# Patient Record
Sex: Male | Born: 1980
Health system: Southern US, Community
[De-identification: ages and names within clinical notes are randomized; demographics above are authoritative.]

## PROBLEM LIST (undated history)

## (undated) DIAGNOSIS — E109 Type 1 diabetes mellitus without complications: Secondary | ICD-10-CM

## (undated) HISTORY — DX: Type 1 diabetes mellitus without complications: E10.9

---

## 2018-01-05 LAB — HEMOGLOBIN A1C: Hemoglobin A1C: 9.7

## 2018-01-17 ENCOUNTER — Encounter: Payer: Self-pay | Admitting: "Endocrinology

## 2018-01-17 ENCOUNTER — Ambulatory Visit: Payer: PRIVATE HEALTH INSURANCE | Admitting: "Endocrinology

## 2018-01-17 VITALS — BP 114/77 | HR 84 | Ht 72.0 in | Wt 147.0 lb

## 2018-01-17 DIAGNOSIS — E1065 Type 1 diabetes mellitus with hyperglycemia: Secondary | ICD-10-CM | POA: Diagnosis not present

## 2018-01-17 DIAGNOSIS — F17200 Nicotine dependence, unspecified, uncomplicated: Secondary | ICD-10-CM | POA: Insufficient documentation

## 2018-01-17 MED ORDER — INSULIN GLARGINE 300 UNIT/ML ~~LOC~~ SOPN: 20.0000 [IU] | PEN_INJECTOR | Freq: Every day | SUBCUTANEOUS | 2 refills | Status: DC

## 2018-01-17 MED ORDER — ONETOUCH VERIO W/DEVICE KIT: 1.0000 | PACK | 0 refills | Status: DC | PRN

## 2018-01-17 MED ORDER — GLUCOSE BLOOD VI STRP: ORAL_STRIP | 3 refills | Status: DC

## 2018-01-17 MED ORDER — INSULIN LISPRO 100 UNIT/ML (KWIKPEN): 5.0000 [IU] | PEN_INJECTOR | Freq: Three times a day (TID) | SUBCUTANEOUS | 3 refills | Status: DC

## 2018-01-17 NOTE — Progress Notes (Signed)
Endocrinology Consult Note       01/17/2018, 1:52 PM   Subjective:    Patient ID: Jose Walters, male    DOB: 1981-04-29.  Jose Walters is being seen in consultation for management of currently uncontrolled symptomatic diabetes requested by  [name], MD.   Past Medical History:  Diagnosis Date  . Diabetes mellitus type I ([place])    History reviewed. No pertinent surgical history. Social History   Socioeconomic History  . Marital status: Married    Spouse name: Not on file  . Number of children: Not on file  . Years of education: Not on file  . Highest education level: Not on file  Occupational History  . Not on file  Social Needs  . Financial resource strain: Not on file  . Food insecurity:    Worry: Not on file    Inability: Not on file  . Transportation needs:    Medical: Not on file    Non-medical: Not on file  Tobacco Use  . Smoking status: Current Every Day Smoker    Packs/day: 1.00    Years: 12.00    Pack years: 12.00  . Smokeless tobacco: Never Used  Substance and Sexual Activity  . Alcohol use: Yes    Comment: Occasional  . Drug use: Never  . Sexual activity: Not on file  Lifestyle  . Physical activity:    Days per week: Not on file    Minutes per session: Not on file  . Stress: Not on file  Relationships  . Social connections:    Talks on phone: Not on file    Gets together: Not on file    Attends religious service: Not on file    Active member of club or organization: Not on file    Attends meetings of clubs or organizations: Not on file    Relationship status: Not on file  Other Topics Concern  . Not on file  Social History Narrative  . Not on file   Outpatient Encounter Medications as of 01/17/2018  Medication Sig  . Blood Glucose Monitoring Suppl (ONETOUCH VERIO) w/Device KIT 1 each by Does not apply route as needed.  [name] glucose blood (ONETOUCH VERIO)  test strip Use as instructed  . Insulin Glargine (TOUJEO SOLOSTAR) 300 UNIT/ML SOPN Inject 20 Units into the skin at bedtime.  . insulin lispro (HUMALOG KWIKPEN) 100 UNIT/ML KiwkPen Inject 0.05-0.11 mLs (5-11 Units total) into the skin 3 (three) times daily.  . [DISCONTINUED] HUMALOG 100 UNIT/ML injection 3 (three) times daily.  . [DISCONTINUED] LEVEMIR 100 UNIT/ML injection 20 Units 2 (two) times daily.   No facility-administered encounter medications on file as of 01/17/2018.     ALLERGIES: Allergies  Allergen Reactions  . Erythromycin     VACCINATION STATUS:  There is no immunization history on file for this patient.  Diabetes  He presents for his initial diabetic visit. He has type 1 diabetes mellitus. Onset time: He was diagnosed at approximate age of 37 years. His disease course has been worsening. There are no hypoglycemic associated symptoms. Pertinent negatives for hypoglycemia include no confusion, headaches, pallor  or seizures. Associated symptoms include polydipsia and polyuria. Pertinent negatives for diabetes include no chest pain, no fatigue, no polyphagia and no weakness. There are no hypoglycemic complications. There are no diabetic complications. Risk factors for coronary artery disease include diabetes mellitus, tobacco exposure and male sex. His weight is decreasing steadily. He is following a generally unhealthy diet. He has not had a previous visit with a dietitian. He participates in exercise intermittently. (He brought a meter showing significantly fluctuating blood glucose profile, average greater than 250 mg/dL.  His most recent A1c was 9.7% on Jan 05, 2018.) An ACE inhibitor/angiotensin II receptor blocker is not being taken.     Review of Systems  Constitutional: Negative for chills, fatigue, fever and unexpected weight change.  HENT: Negative for dental problem, mouth sores and trouble swallowing.   Eyes: Negative for visual disturbance.  Respiratory: Negative  for cough, choking, chest tightness, shortness of breath and wheezing.   Cardiovascular: Negative for chest pain, palpitations and leg swelling.  Gastrointestinal: Negative for abdominal distention, abdominal pain, constipation, diarrhea, nausea and vomiting.  Endocrine: Positive for polydipsia and polyuria. Negative for polyphagia.  Genitourinary: Negative for dysuria, flank pain, hematuria and urgency.  Musculoskeletal: Negative for back pain, gait problem, myalgias and neck pain.  Skin: Negative for pallor, rash and wound.  Neurological: Negative for seizures, syncope, weakness, numbness and headaches.  Psychiatric/Behavioral: Negative for confusion and dysphoric mood.    Objective:    BP 114/77   Pulse 84   Ht 6' (1.829 m)   Wt 147 lb (66.7 kg)   BMI 19.94 kg/m   Wt Readings from Last 3 Encounters:  01/17/18 147 lb (66.7 kg)     Physical Exam  Constitutional: He is oriented to person, place, and time. He appears well-developed and well-nourished. He is cooperative. No distress.  HENT:  Head: Normocephalic and atraumatic.  Eyes: EOM are normal.  Neck: Normal range of motion. Neck supple. No tracheal deviation present. No thyromegaly present.  Cardiovascular: Normal rate, S1 normal, S2 normal and normal heart sounds. Exam reveals no gallop.  No murmur heard. Pulses:      Dorsalis pedis pulses are 1+ on the right side, and 1+ on the left side.       Posterior tibial pulses are 1+ on the right side, and 1+ on the left side.  Pulmonary/Chest: Breath sounds normal. No respiratory distress. He has no wheezes.  Abdominal: Soft. Bowel sounds are normal. He exhibits no distension. There is no tenderness. There is no guarding and no CVA tenderness.  Musculoskeletal: He exhibits no edema.       Right shoulder: He exhibits no swelling and no deformity.  Neurological: He is alert and oriented to person, place, and time. He has normal strength and normal reflexes. No cranial nerve deficit  or sensory deficit. Gait normal.  Skin: Skin is warm and dry. No rash noted. No cyanosis. Nails show no clubbing.  Psychiatric: He has a normal mood and affect. His speech is normal. Judgment normal. Cognition and memory are normal.    Recent Results (from the past 2160 hour(s))  Hemoglobin A1c     Status: None   Collection Time: 01/05/18 12:00 AM  Result Value Ref Range   Hemoglobin A1C 9.7       Assessment & Plan:   1. Uncontrolled type 1 diabetes mellitus with hyperglycemia ([place])  - Jose Walters has currently uncontrolled symptomatic type 1 DM since 37   years of age.  He  has repeated episodes of diabetic ketoacidosis at least on 4 separate occasions in the past most recent in February 2018.  His most recent A1c was 9.7% from Jan 05, 2018. -his diabetes is complicated by recurrent DKA's, chronic heavy smoking and Jose Walters remains at a high risk for more acute and chronic complications which include CAD, CVA, CKD, retinopathy, and neuropathy. These are all discussed in detail with the patient.  - I have counseled him on diet management  by adopting a carbohydrate restricted/protein rich diet.  - Suggestion is made for him to avoid simple carbohydrates  from his diet including Cakes, Sweet Desserts, Ice Cream, Soda (diet and regular), Sweet Tea, Candies, Chips, Cookies, Store Bought Juices, Alcohol in Excess of  1-2 drinks a day, Artificial Sweeteners, and "Sugar-free" Products. This will help patient to have stable blood glucose profile and potentially avoid unintended weight gain.  - I encouraged him to switch to  unprocessed or minimally processed complex starch and increased protein intake (animal or plant source), fruits, and vegetables.  - he is advised to stick to a routine mealtimes to eat 3 meals  a day and avoid unnecessary snacks ( to snack only to correct hypoglycemia).   - he will be scheduled with Jearld Fenton, RDN, CDE for individualized diabetes  education.  - I have approached him with the following individualized plan to manage diabetes and patient agrees:   -He will be approached for standardized basal/bolus insulin regimen.  -Benefit from better basal insulin choices.  I discussed and initiated Toujeo 20 units nightly, Humalog 5 units  3 times a day with meals  for pre-meal BG readings of 90-'150mg'$ /dl, plus patient specific correction dose for unexpected hyperglycemia above '150mg'$ /dl, associated with strict monitoring of glucose 4 times a day-before meals and at bedtime. - Patient is warned not to take insulin without proper monitoring per orders. -Adjustment parameters are given for hypo and hyperglycemia in writing. -Patient is encouraged to call clinic for blood glucose levels less than 70 or above 200 mg /dl. -He is not a candidate for metformin, SGLT2 inhibitors, nor incretin therapy. - Patient specific target  A1c;  LDL, HDL, Triglycerides, and  Waist Circumference were discussed in detail.  2) BP/HTN: His blood pressure is controlled to target.  He is not on antihypertensive medications. 3) Lipids/HPL:   No recent lipid panel to review, will be considered for fasting lipid panel on subsequent visits.  He is not on statins.  4)  Weight/Diet: He has weight deficit, may benefit from 10 to 15 pound weight gain.  CDE Consult will be initiated , exercise, and detailed carbohydrates information provided.  5) Chronic Care/Health Maintenance:  -he   is encouraged to continue to follow up with Ophthalmology, Dentist,  Podiatrist at least yearly or according to recommendations, and advised to  Quit smoking. I have recommended yearly flu vaccine and pneumonia vaccination at least every 5 years; moderate intensity exercise for up to 150 minutes weekly; and  sleep for at least 7 hours a day.  - I advised patient to maintain close follow up with [name], MD for primary care needs.  - Time spent with the patient: 45 minutes, of which  >50% was spent in obtaining information about his symptoms, reviewing his previous labs, evaluations, and treatments, counseling him about his currently uncontrolled, complicated type 1 diabetes with recurrent DKA, chronic heavy smoking, and developing a plan to confirm the diagnosis and long term treatment as necessary.  Despina Hick  [place] participated in the discussions, expressed understanding, and voiced agreement with the above plans.  All questions were answered to his satisfaction. he is encouraged to contact clinic should he have any questions or concerns prior to his return visit.  Follow up plan: - Return in about 1 week (around 01/24/2018) for follow up with meter and logs- no labs.  Glade Lloyd, MD [facility] [address] [place], [place] [address] Phone: [phone]  Fax: [phone]    01/17/2018, 1:52 PM  This note was partially dictated with voice recognition software. Similar sounding words can be transcribed inadequately or may not  be corrected upon review.

## 2018-01-17 NOTE — Patient Instructions (Signed)

## 2018-01-25 ENCOUNTER — Encounter: Payer: Self-pay | Admitting: "Endocrinology

## 2018-01-25 ENCOUNTER — Ambulatory Visit (INDEPENDENT_AMBULATORY_CARE_PROVIDER_SITE_OTHER): Payer: BLUE CROSS/BLUE SHIELD | Admitting: "Endocrinology

## 2018-01-25 VITALS — BP 115/71 | HR 76 | Ht 72.0 in | Wt 145.0 lb

## 2018-01-25 DIAGNOSIS — E1065 Type 1 diabetes mellitus with hyperglycemia: Secondary | ICD-10-CM

## 2018-01-25 DIAGNOSIS — F17200 Nicotine dependence, unspecified, uncomplicated: Secondary | ICD-10-CM | POA: Diagnosis not present

## 2018-01-25 MED ORDER — INSULIN GLARGINE 300 UNIT/ML ~~LOC~~ SOPN: 30.0000 [IU] | PEN_INJECTOR | Freq: Every day | SUBCUTANEOUS | 2 refills | Status: DC

## 2018-01-25 MED ORDER — INSULIN LISPRO 200 UNIT/ML ~~LOC~~ SOPN: 8.0000 [IU] | PEN_INJECTOR | Freq: Three times a day (TID) | SUBCUTANEOUS | 2 refills | Status: DC

## 2018-01-25 NOTE — Progress Notes (Signed)
Endocrinology follow-up note       01/25/2018, 11:10 AM   Subjective:    Patient ID: Jose Walters, male    DOB: [birth year].  Jose Walters is being seen in follow-up  for management of currently uncontrolled symptomatic diabetes requested by  [name], MD.   Past Medical History:  Diagnosis Date  . Diabetes mellitus type I ([place])    History reviewed. No pertinent surgical history. Social History   Socioeconomic History  . Marital status: Married    Spouse name: Not on file  . Number of children: Not on file  . Years of education: Not on file  . Highest education level: Not on file  Occupational History  . Not on file  Social Needs  . Financial resource strain: Not on file  . Food insecurity:    Worry: Not on file    Inability: Not on file  . Transportation needs:    Medical: Not on file    Non-medical: Not on file  Tobacco Use  . Smoking status: Current Every Day Smoker    Packs/day: 1.00    Years: 12.00    Pack years: 12.00  . Smokeless tobacco: Never Used  Substance and Sexual Activity  . Alcohol use: Yes    Comment: Occasional  . Drug use: Never  . Sexual activity: Not on file  Lifestyle  . Physical activity:    Days per week: Not on file    Minutes per session: Not on file  . Stress: Not on file  Relationships  . Social connections:    Talks on phone: Not on file    Gets together: Not on file    Attends religious service: Not on file    Active member of club or organization: Not on file    Attends meetings of clubs or organizations: Not on file    Relationship status: Not on file  Other Topics Concern  . Not on file  Social History Narrative  . Not on file   Outpatient Encounter Medications as of 01/25/2018  Medication Sig  . Blood Glucose Monitoring Suppl (ONETOUCH VERIO) w/Device KIT 1 each by Does not apply route as needed.  [name] glucose blood (ONETOUCH VERIO)  test strip Use as instructed  . Insulin Glargine (TOUJEO SOLOSTAR) 300 UNIT/ML SOPN Inject 30 Units into the skin at bedtime.  . Insulin Lispro (HUMALOG KWIKPEN) 200 UNIT/ML SOPN Inject 8-14 Units into the skin 3 (three) times daily before meals.  . [DISCONTINUED] Insulin Glargine (TOUJEO SOLOSTAR) 300 UNIT/ML SOPN Inject 20 Units into the skin at bedtime.  . [DISCONTINUED] insulin lispro (HUMALOG KWIKPEN) 100 UNIT/ML KiwkPen Inject 0.05-0.11 mLs (5-11 Units total) into the skin 3 (three) times daily.   No facility-administered encounter medications on file as of 01/25/2018.     ALLERGIES: Allergies  Allergen Reactions  . Erythromycin     VACCINATION STATUS:  There is no immunization history on file for this patient.  Diabetes  He presents for his follow-up diabetic visit. He has type 1 diabetes mellitus. Onset time: He was diagnosed at approximate age of 37 years. His disease course has  been worsening. There are no hypoglycemic associated symptoms. Pertinent negatives for hypoglycemia include no confusion, headaches, pallor or seizures. Associated symptoms include polydipsia and polyuria. Pertinent negatives for diabetes include no chest pain, no fatigue, no polyphagia and no weakness. There are no hypoglycemic complications. Symptoms are worsening. There are no diabetic complications. Risk factors for coronary artery disease include diabetes mellitus, tobacco exposure and male sex. His weight is stable. He is following a generally unhealthy diet. He has not had a previous visit with a dietitian. He participates in exercise intermittently. His breakfast blood glucose range is generally >200 mg/dl. His lunch blood glucose range is generally 180-200 mg/dl. His dinner blood glucose range is generally 180-200 mg/dl. His bedtime blood glucose range is generally 180-200 mg/dl. His overall blood glucose range is >200 mg/dl. (He brought a meter and a log still showing significant fluctuations, however no  reported nor documented hypoglycemia.     His most recent A1c was 9.7% on Jan 05, 2018.) An ACE inhibitor/angiotensin II receptor blocker is not being taken.     Review of Systems  Constitutional: Negative for chills, fatigue, fever and unexpected weight change.  HENT: Negative for dental problem, mouth sores and trouble swallowing.   Eyes: Negative for visual disturbance.  Respiratory: Negative for cough, choking, chest tightness, shortness of breath and wheezing.   Cardiovascular: Negative for chest pain, palpitations and leg swelling.  Gastrointestinal: Negative for abdominal distention, abdominal pain, constipation, diarrhea, nausea and vomiting.  Endocrine: Positive for polydipsia and polyuria. Negative for polyphagia.  Genitourinary: Negative for dysuria, flank pain, hematuria and urgency.  Musculoskeletal: Negative for back pain, gait problem, myalgias and neck pain.  Skin: Negative for pallor, rash and wound.  Neurological: Negative for seizures, syncope, weakness, numbness and headaches.  Psychiatric/Behavioral: Negative for confusion and dysphoric mood.    Objective:    BP 115/71   Pulse 76   Ht 6' (1.829 m)   Wt 145 lb (65.8 kg)   BMI 19.67 kg/m   Wt Readings from Last 3 Encounters:  01/25/18 145 lb (65.8 kg)  01/17/18 147 lb (66.7 kg)     Physical Exam  Constitutional: He is oriented to person, place, and time. He appears well-developed. He is cooperative. No distress.  HENT:  Head: Normocephalic and atraumatic.  Eyes: EOM are normal.  Neck: Normal range of motion. Neck supple. No tracheal deviation present. No thyromegaly present.  Cardiovascular: Normal rate, S1 normal and S2 normal. Exam reveals no gallop.  No murmur heard. Pulses:      Dorsalis pedis pulses are 1+ on the right side, and 1+ on the left side.       Posterior tibial pulses are 1+ on the right side, and 1+ on the left side.  Pulmonary/Chest: Effort normal. No respiratory distress. He has no  wheezes.  Abdominal: He exhibits no distension. There is no tenderness. There is no guarding and no CVA tenderness.  Musculoskeletal: He exhibits no edema.       Right shoulder: He exhibits no swelling and no deformity.  Neurological: He is alert and oriented to person, place, and time. He has normal strength and normal reflexes. No cranial nerve deficit or sensory deficit. Gait normal.  Skin: Skin is warm and dry. No rash noted. No cyanosis. Nails show no clubbing.  Psychiatric: He has a normal mood and affect. His speech is normal. Judgment normal. Cognition and memory are normal.    Recent Results (from the past 2160 hour(s))  Hemoglobin A1c  Status: None   Collection Time: 01/05/18 12:00 AM  Result Value Ref Range   Hemoglobin A1C 9.7       Assessment & Plan:   1. Uncontrolled type 1 diabetes mellitus with hyperglycemia ([place])  - KAWHI DIEBOLD has currently uncontrolled symptomatic type 1 DM since 37   years of age.  He has repeated episodes of diabetic ketoacidosis at least on 4 separate occasions in the past most recent in February 2018.  His most recent A1c was 9.7% from Jan 05, 2018.   -his diabetes is complicated by recurrent DKA's, chronic heavy smoking and SEANPATRICK MAISANO remains at a high risk for more acute and chronic complications which include CAD, CVA, CKD, retinopathy, and neuropathy. These are all discussed in detail with the patient. -He returns with a log and meter showing still fluctuating blood glucose profile, however significantly better than last visit.  No hypoglycemia. - I have counseled him on diet management  by adopting a carbohydrate restricted/protein rich diet.  -  Suggestion is made for him to avoid simple carbohydrates  from his diet including Cakes, Sweet Desserts / Pastries, Ice Cream, Soda (diet and regular), Sweet Tea, Candies, Chips, Cookies, Store Bought Juices, Alcohol in Excess of  1-2 drinks a day, Artificial Sweeteners, and "Sugar-free"  Products. This will help patient to have stable blood glucose profile and potentially avoid unintended weight gain.  - I encouraged him to switch to  unprocessed or minimally processed complex starch and increased protein intake (animal or plant source), fruits, and vegetables.  - he is advised to stick to a routine mealtimes to eat 3 meals  a day and avoid unnecessary snacks ( to snack only to correct hypoglycemia).   - he has been scheduled with Jearld Fenton, RDN, CDE for individualized diabetes education.  - I have approached him with the following individualized plan to manage diabetes and patient agrees:   - He is doing better on  standardized basal/bolus insulin regimen.  -I discussed and increased his Toujeo to 30 units nightly, increase his Humalog to 8 units  3 times a day with meals  for pre-meal BG readings of 90-167m/dl, plus patient specific correction dose for unexpected hyperglycemia above 1552mdl, associated with strict monitoring of glucose 4 times a day-before meals and at bedtime. - Patient is warned not to take insulin without proper monitoring per orders. -Adjustment parameters are given for hypo and hyperglycemia in writing. -Patient is encouraged to call clinic for blood glucose levels less than 70 or above 200 mg /dl. -He is not a candidate for metformin, SGLT2 inhibitors, nor incretin therapy. - Patient specific target  A1c;  LDL, HDL, Triglycerides, and  Waist Circumference were discussed in detail.  2) BP/HTN: His blood pressure is controlled to target.   He is not on antihypertensive medications. 3) Lipids/HPL:   No recent lipid panel to review, he will be considered for fasting lipid panel on his next lab work.   He is not on statins.  4)  Weight/Diet: He has weight deficit, may benefit from 10 to 15 pound weight gain.  CDE Consult will be initiated , exercise, and detailed carbohydrates information provided.  5) Chronic Care/Health Maintenance:  -he   is  encouraged to continue to follow up with Ophthalmology, Dentist,  Podiatrist at least yearly or according to recommendations, and advised to  Quit smoking. I have recommended yearly flu vaccine and pneumonia vaccination at least every 5 years; moderate intensity exercise for up to  150 minutes weekly; and  sleep for at least 7 hours a day.  - I advised patient to maintain close follow up with [name], MD for primary care needs.  - Time spent with the patient: 25 min, of which >50% was spent in reviewing his blood glucose logs , discussing his hypo- and hyper-glycemic episodes, reviewing his current and  previous labs and insulin doses and developing a plan to avoid hypo- and hyper-glycemia. Please refer to Patient Instructions for Blood Glucose Monitoring and Insulin/Medications Dosing Guide"  in media tab for additional information. Avie Arenas participated in the discussions, expressed understanding, and voiced agreement with the above plans.  All questions were answered to his satisfaction. he is encouraged to contact clinic should he have any questions or concerns prior to his return visit.   Follow up plan: - Return in about 2 months (around 04/05/2018) for follow up with pre-visit labs, meter, and logs.  Glade Lloyd, MD [facility] [address] [place], [place] [address] Phone: [phone]  Fax: [phone]    01/25/2018, 11:10 AM  This note was partially dictated with voice recognition software. Similar sounding words can be transcribed inadequately or may not  be corrected upon review.

## 2018-03-16 ENCOUNTER — Ambulatory Visit: Payer: Self-pay | Admitting: Nutrition

## 2018-03-17 ENCOUNTER — Telehealth: Payer: Self-pay | Admitting: Orthopedic Surgery

## 2018-03-17 NOTE — Telephone Encounter (Signed)
Requesting U 200 pen so he can use the coupon. He said he talked with Dr Fransico HimNida and he thought is was going to be done a few months ago but the pharmacy said it is not changed yet. Please call in on Monday he needs to get a refill.

## 2018-03-20 ENCOUNTER — Other Ambulatory Visit: Payer: Self-pay

## 2018-03-20 MED ORDER — INSULIN GLARGINE 300 UNIT/ML ~~LOC~~ SOPN: 30.0000 [IU] | PEN_INJECTOR | Freq: Every day | SUBCUTANEOUS | 2 refills | Status: DC

## 2018-03-20 MED ORDER — INSULIN LISPRO 200 UNIT/ML ~~LOC~~ SOPN: 8.0000 [IU] | PEN_INJECTOR | Freq: Three times a day (TID) | SUBCUTANEOUS | 2 refills | Status: DC

## 2018-03-20 NOTE — Telephone Encounter (Signed)
Rx sent 

## 2018-03-20 NOTE — Addendum Note (Signed)
Addended by: Jannifer FranklinFRENCH, Taronda Comacho A on: 03/20/2018 04:36 PM   Modules accepted: Orders

## 2018-03-21 LAB — BASIC METABOLIC PANEL
BUN: 22 — AB (ref 4–21)
Creatinine: 0.9 (ref 0.6–1.3)

## 2018-03-21 LAB — LIPID PANEL
Cholesterol: 172 (ref 0–200)
HDL: 64 (ref 35–70)
LDL Cholesterol: 93
Triglycerides: 73 (ref 40–160)

## 2018-03-21 LAB — HEMOGLOBIN A1C: Hemoglobin A1C: 7.8

## 2018-03-21 LAB — TSH: TSH: 11.8 — AB (ref <=5.90)

## 2018-03-21 LAB — MICROALBUMIN, URINE: Microalb, Ur: 1.2

## 2018-03-21 LAB — VITAMIN D 25 HYDROXY (VIT D DEFICIENCY, FRACTURES): Vit D, 25-Hydroxy: 34

## 2018-03-27 ENCOUNTER — Ambulatory Visit (INDEPENDENT_AMBULATORY_CARE_PROVIDER_SITE_OTHER): Payer: BLUE CROSS/BLUE SHIELD | Admitting: "Endocrinology

## 2018-03-27 ENCOUNTER — Encounter: Payer: Self-pay | Admitting: "Endocrinology

## 2018-03-27 VITALS — BP 136/86 | HR 92 | Ht 72.0 in | Wt 144.0 lb

## 2018-03-27 DIAGNOSIS — E039 Hypothyroidism, unspecified: Secondary | ICD-10-CM

## 2018-03-27 DIAGNOSIS — E1065 Type 1 diabetes mellitus with hyperglycemia: Secondary | ICD-10-CM

## 2018-03-27 DIAGNOSIS — F17200 Nicotine dependence, unspecified, uncomplicated: Secondary | ICD-10-CM | POA: Diagnosis not present

## 2018-03-27 MED ORDER — LEVOTHYROXINE SODIUM 50 MCG PO TABS: 50.0000 ug | ORAL_TABLET | Freq: Every day | ORAL | 3 refills | Status: DC

## 2018-03-27 NOTE — Progress Notes (Signed)
Endocrinology follow-up note       03/27/2018, 9:57 AM   Subjective:    Patient ID: Jose Walters, male    DOB: 13-Sep-1980.  Jose Walters is being seen in follow-up  for management of currently uncontrolled symptomatic diabetes requested by  [name], MD.   Past Medical History:  Diagnosis Date  . Diabetes mellitus type I ([place])    History reviewed. No pertinent surgical history. Social History   Socioeconomic History  . Marital status: Married    Spouse name: Not on file  . Number of children: Not on file  . Years of education: Not on file  . Highest education level: Not on file  Occupational History  . Not on file  Social Needs  . Financial resource strain: Not on file  . Food insecurity:    Worry: Not on file    Inability: Not on file  . Transportation needs:    Medical: Not on file    Non-medical: Not on file  Tobacco Use  . Smoking status: Current Every Day Smoker    Packs/day: 1.00    Years: 12.00    Pack years: 12.00  . Smokeless tobacco: Never Used  Substance and Sexual Activity  . Alcohol use: Yes    Comment: Occasional  . Drug use: Never  . Sexual activity: Not on file  Lifestyle  . Physical activity:    Days per week: Not on file    Minutes per session: Not on file  . Stress: Not on file  Relationships  . Social connections:    Talks on phone: Not on file    Gets together: Not on file    Attends religious service: Not on file    Active member of club or organization: Not on file    Attends meetings of clubs or organizations: Not on file    Relationship status: Not on file  Other Topics Concern  . Not on file  Social History Narrative  . Not on file   Outpatient Encounter Medications as of 03/27/2018  Medication Sig  . Blood Glucose Monitoring Suppl (ONETOUCH VERIO) w/Device KIT 1 each by Does not apply route as needed.  [name] glucose blood (ONETOUCH VERIO)  test strip Use as instructed  . Insulin Glargine (TOUJEO SOLOSTAR) 300 UNIT/ML SOPN Inject 30 Units into the skin at bedtime.  . Insulin Lispro (HUMALOG KWIKPEN) 200 UNIT/ML SOPN Inject 8-14 Units into the skin 3 (three) times daily before meals.  [name] levothyroxine (SYNTHROID, LEVOTHROID) 50 MCG tablet Take 1 tablet (50 mcg total) by mouth daily before breakfast.  . [DISCONTINUED] Insulin Glargine 300 UNIT/ML SOPN Inject 30 Units into the skin at bedtime.   No facility-administered encounter medications on file as of 03/27/2018.     ALLERGIES: Allergies  Allergen Reactions  . Erythromycin     VACCINATION STATUS:  There is no immunization history on file for this patient.  Diabetes  He presents for his follow-up diabetic visit. He has type 1 diabetes mellitus. Onset time: He was diagnosed at approximate age of 37 years. His disease course has been improving. There are no hypoglycemic  associated symptoms. Pertinent negatives for hypoglycemia include no confusion, headaches, pallor or seizures. Pertinent negatives for diabetes include no chest pain, no fatigue, no polydipsia, no polyphagia, no polyuria and no weakness. There are no hypoglycemic complications. Symptoms are improving. There are no diabetic complications. Risk factors for coronary artery disease include diabetes mellitus, tobacco exposure and male sex. Current diabetic treatment includes insulin injections. His weight is stable. He is following a generally unhealthy diet. He has not had a previous visit with a dietitian. He participates in exercise intermittently. His home blood glucose trend is fluctuating minimally. His breakfast blood glucose range is generally 180-200 mg/dl. His lunch blood glucose range is generally 180-200 mg/dl. His dinner blood glucose range is generally 180-200 mg/dl. His bedtime blood glucose range is generally 180-200 mg/dl. His overall blood glucose range is 180-200 mg/dl. An ACE inhibitor/angiotensin II  receptor blocker is not being taken.     Review of Systems  Constitutional: Negative for chills, fatigue, fever and unexpected weight change.  HENT: Negative for dental problem, mouth sores and trouble swallowing.   Eyes: Negative for visual disturbance.  Respiratory: Negative for cough, choking, chest tightness, shortness of breath and wheezing.   Cardiovascular: Negative for chest pain, palpitations and leg swelling.  Gastrointestinal: Negative for abdominal distention, abdominal pain, constipation, diarrhea, nausea and vomiting.  Endocrine: Negative for polydipsia, polyphagia and polyuria.  Genitourinary: Negative for dysuria, flank pain, hematuria and urgency.  Musculoskeletal: Negative for back pain, gait problem, myalgias and neck pain.  Skin: Negative for pallor, rash and wound.  Neurological: Negative for seizures, syncope, weakness, numbness and headaches.  Psychiatric/Behavioral: Negative for confusion and dysphoric mood.    Objective:    BP 136/86   Pulse 92   Ht 6' (1.829 m)   Wt 144 lb (65.3 kg)   BMI 19.53 kg/m   Wt Readings from Last 3 Encounters:  03/27/18 144 lb (65.3 kg)  01/25/18 145 lb (65.8 kg)  01/17/18 147 lb (66.7 kg)     Physical Exam  Constitutional: He is oriented to person, place, and time. He appears well-developed. He is cooperative. No distress.  HENT:  Head: Normocephalic and atraumatic.  Eyes: EOM are normal.  Neck: Normal range of motion. Neck supple. No tracheal deviation present. No thyromegaly present.  Cardiovascular: Normal rate, S1 normal and S2 normal. Exam reveals no gallop.  No murmur heard. Pulses:      Dorsalis pedis pulses are 1+ on the right side, and 1+ on the left side.       Posterior tibial pulses are 1+ on the right side, and 1+ on the left side.  Pulmonary/Chest: Effort normal. No respiratory distress. He has no wheezes.  Abdominal: He exhibits no distension. There is no tenderness. There is no guarding and no CVA  tenderness.  Musculoskeletal: He exhibits no edema.       Right shoulder: He exhibits no swelling and no deformity.  Neurological: He is alert and oriented to person, place, and time. He has normal strength and normal reflexes. No cranial nerve deficit or sensory deficit. Gait normal.  Skin: Skin is warm and dry. No rash noted. No cyanosis. Nails show no clubbing.  Psychiatric: He has a normal mood and affect. His speech is normal. Judgment normal. Cognition and memory are normal.    Recent Results (from the past 2160 hour(s))  Hemoglobin A1c     Status: None   Collection Time: 01/05/18 12:00 AM  Result Value Ref Range   Hemoglobin A1C  9.7   Microalbumin, urine     Status: None   Collection Time: 03/21/18 12:00 AM  Result Value Ref Range   Microalb, Ur 1.2   VITAMIN D 25 Hydroxy (Vit-D Deficiency, Fractures)     Status: None   Collection Time: 03/21/18 12:00 AM  Result Value Ref Range   Vit D, 25-Hydroxy 34   Basic metabolic panel     Status: Abnormal   Collection Time: 03/21/18 12:00 AM  Result Value Ref Range   BUN 22 (A) 4 - 21   Creatinine 0.9 0.6 - 1.3  Lipid panel     Status: None   Collection Time: 03/21/18 12:00 AM  Result Value Ref Range   Triglycerides 73 40 - 160   Cholesterol 172 0 - 200   HDL 64 35 - 70   LDL Cholesterol 93   Hemoglobin A1c     Status: None   Collection Time: 03/21/18 12:00 AM  Result Value Ref Range   Hemoglobin A1C 7.8   TSH     Status: Abnormal   Collection Time: 03/21/18 12:00 AM  Result Value Ref Range   TSH 11.80 (A) 0.41 - 5.90    Comment: free t4 0.72      Assessment & Plan:   1. Uncontrolled type 1 diabetes mellitus with hyperglycemia ([place])  - Jose Walters has currently uncontrolled symptomatic type 1 DM since 37   years of age.   -He returns with significant improvement in his glycemic profile, still minimally fluctuating, no major hypoglycemia.  He is previsit labs show A1c of 7.8% improving from 9.7%.    He has prior  history of repeated episodes of diabetic ketoacidosis at least on 4 separate occasions in the past most recent in February 2018.   -his diabetes is complicated by recurrent DKA's, chronic heavy smoking and Jose Walters remains at a high risk for more acute and chronic complications which include CAD, CVA, CKD, retinopathy, and neuropathy. These are all discussed in detail with the patient.  - I have counseled him on diet management  by adopting a carbohydrate restricted/protein rich diet.  -  Suggestion is made for him to avoid simple carbohydrates  from his diet including Cakes, Sweet Desserts / Pastries, Ice Cream, Soda (diet and regular), Sweet Tea, Candies, Chips, Cookies, Store Bought Juices, Alcohol in Excess of  1-2 drinks a day, Artificial Sweeteners, and "Sugar-free" Products. This will help patient to have stable blood glucose profile and potentially avoid unintended weight gain.  - I encouraged him to switch to  unprocessed or minimally processed complex starch and increased protein intake (animal or plant source), fruits, and vegetables.  - he is advised to stick to a routine mealtimes to eat 3 meals  a day and avoid unnecessary snacks ( to snack only to correct hypoglycemia).   - he has been scheduled with Jearld Fenton, RDN, CDE for individualized diabetes education.  - I have approached him with the following individualized plan to manage diabetes and patient agrees:   - He continued to do better on   standardized basal/bolus insulin regimen.  -I discussed and continued Toujeo 30 units nightly , continue Humalog 8 units 3 times daily AC  for pre-meal BG readings of 90-111m/dl, plus patient specific correction dose for unexpected hyperglycemia above 1556mdl, associated with strict monitoring of glucose 4 times a day-before meals and at bedtime. - Patient is warned not to take insulin without proper monitoring per orders. -Adjustment parameters are given  for hypo and  hyperglycemia in writing. -Patient is encouraged to call clinic for blood glucose levels less than 70 or above 200 mg /dl. -He is not a candidate for metformin, SGLT2 inhibitors, nor incretin therapy. - Patient specific target  A1c;  LDL, HDL, Triglycerides, and  Waist Circumference were discussed in detail.  2) BP/HTN: His blood pressure is controlled to target.   He is not on antihypertensive medications. 3) Lipids/HPL:   Recent lipid panel showed uncontrolled LDL at 93, HDL of 64.  He will be observed without statin treatment.   4)  Weight/Diet: He has weight deficit, may benefit from 10 to 15 pound weight gain.  CDE Consult will be initiated , exercise, and detailed carbohydrates information provided.  5) hypothyroidism: New diagnosis for him -He reports thyroid cancer in his mother.  He will be offered baseline thyroid ultrasound. I discussed and initiated levothyroxine 50 mcg p.o. every morning for him.  - We discussed about correct intake of levothyroxine, at fasting, with water, separated by at least 30 minutes from breakfast, and separated by more than 4 hours from calcium, iron, multivitamins, acid reflux medications (PPIs). -Patient is made aware of the fact that thyroid hormone replacement is needed for life, dose to be adjusted by periodic monitoring of thyroid function tests.  6) Chronic Care/Health Maintenance:  -he   is encouraged to continue to follow up with Ophthalmology, Dentist,  Podiatrist at least yearly or according to recommendations, and advised to  Quit smoking. I have recommended yearly flu vaccine and pneumonia vaccination at least every 5 years; moderate intensity exercise for up to 150 minutes weekly; and  sleep for at least 7 hours a day.  - I advised patient to maintain close follow up with [name], MD for primary care needs.  - Time spent with the patient: 25 min, of which >50% was spent in reviewing his blood glucose logs , discussing his hypo- and  hyper-glycemic episodes, reviewing his current and  previous labs and insulin doses and developing a plan to avoid hypo- and hyper-glycemia. Please refer to Patient Instructions for Blood Glucose Monitoring and Insulin/Medications Dosing Guide"  in media tab for additional information. Avie Arenas participated in the discussions, expressed understanding, and voiced agreement with the above plans.  All questions were answered to his satisfaction. he is encouraged to contact clinic should he have any questions or concerns prior to his return visit.   Follow up plan: - Return in about 3 months (around 06/27/2018) for Meter, and Logs, Thyroid / Neck Ultrasound.  Glade Lloyd, MD [facility] [address] [place], [place] [address] Phone: [phone]  Fax: [phone]    03/27/2018, 9:57 AM  This note was partially dictated with voice recognition software. Similar sounding words can be transcribed inadequately or may not  be corrected upon review.

## 2018-04-06 ENCOUNTER — Ambulatory Visit (HOSPITAL_COMMUNITY)
Admission: RE | Admit: 2018-04-06 | Discharge: 2018-04-06 | Disposition: A | Discharge status: Home / Self Care | Payer: BLUE CROSS/BLUE SHIELD | Source: Ambulatory Visit | Attending: "Endocrinology | Admitting: "Endocrinology

## 2018-04-06 DIAGNOSIS — E039 Hypothyroidism, unspecified: Secondary | ICD-10-CM | POA: Diagnosis not present

## 2018-05-04 ENCOUNTER — Other Ambulatory Visit: Payer: Self-pay

## 2018-05-04 ENCOUNTER — Other Ambulatory Visit: Payer: Self-pay | Admitting: "Endocrinology

## 2018-05-04 MED ORDER — INSULIN ASPART 100 UNIT/ML FLEXPEN: 8.0000 [IU] | PEN_INJECTOR | Freq: Three times a day (TID) | SUBCUTANEOUS | 2 refills | Status: DC

## 2018-06-15 ENCOUNTER — Other Ambulatory Visit: Payer: Self-pay | Admitting: "Endocrinology

## 2018-07-03 ENCOUNTER — Other Ambulatory Visit: Payer: Self-pay | Admitting: "Endocrinology

## 2018-07-03 ENCOUNTER — Ambulatory Visit (INDEPENDENT_AMBULATORY_CARE_PROVIDER_SITE_OTHER): Payer: BLUE CROSS/BLUE SHIELD | Admitting: "Endocrinology

## 2018-07-03 ENCOUNTER — Encounter: Payer: Self-pay | Admitting: "Endocrinology

## 2018-07-03 VITALS — BP 126/71 | HR 74 | Ht 72.0 in | Wt 149.0 lb

## 2018-07-03 DIAGNOSIS — E1065 Type 1 diabetes mellitus with hyperglycemia: Secondary | ICD-10-CM

## 2018-07-03 DIAGNOSIS — E039 Hypothyroidism, unspecified: Secondary | ICD-10-CM

## 2018-07-03 DIAGNOSIS — F17200 Nicotine dependence, unspecified, uncomplicated: Secondary | ICD-10-CM

## 2018-07-03 MED ORDER — FREESTYLE LIBRE 14 DAY READER DEVI: 1.0000 | Freq: Once | 0 refills | Status: AC

## 2018-07-03 MED ORDER — FREESTYLE LIBRE 14 DAY SENSOR MISC: 1.0000 | 2 refills | Status: DC

## 2018-07-03 NOTE — Progress Notes (Signed)
Endocrinology follow-up note       07/03/2018, 3:30 PM   Subjective:    Patient ID: Jose Walters, male    DOB: 06-05-1981.  Jose Walters is being seen in follow-up  for management of currently uncontrolled symptomatic type 1 diabetes, hypothyroidism. PMD:   [name], MD.   Past Medical History:  Diagnosis Date  . Diabetes mellitus type I ([place])    History reviewed. No pertinent surgical history. Social History   Socioeconomic History  . Marital status: Married    Spouse name: Not on file  . Number of children: Not on file  . Years of education: Not on file  . Highest education level: Not on file  Occupational History  . Not on file  Social Needs  . Financial resource strain: Not on file  . Food insecurity:    Worry: Not on file    Inability: Not on file  . Transportation needs:    Medical: Not on file    Non-medical: Not on file  Tobacco Use  . Smoking status: Current Every Day Smoker    Packs/day: 1.00    Years: 12.00    Pack years: 12.00  . Smokeless tobacco: Never Used  Substance and Sexual Activity  . Alcohol use: Yes    Comment: Occasional  . Drug use: Never  . Sexual activity: Not on file  Lifestyle  . Physical activity:    Days per week: Not on file    Minutes per session: Not on file  . Stress: Not on file  Relationships  . Social connections:    Talks on phone: Not on file    Gets together: Not on file    Attends religious service: Not on file    Active member of club or organization: Not on file    Attends meetings of clubs or organizations: Not on file    Relationship status: Not on file  Other Topics Concern  . Not on file  Social History Narrative  . Not on file   Outpatient Encounter Medications as of 07/03/2018  Medication Sig  . Blood Glucose Monitoring Suppl (ONETOUCH VERIO) w/Device KIT 1 each by Does not apply route as needed.  . Continuous  Blood Gluc Receiver (FREESTYLE LIBRE 14 DAY READER) DEVI 1 each by Does not apply route once for 1 dose.  . Continuous Blood Gluc Sensor (FREESTYLE LIBRE 14 DAY SENSOR) MISC Inject 1 each into the skin every 14 (fourteen) days. Use as directed.  . CONTOUR NEXT TEST test strip USE TO TEST FOUR TIMES DAILY.  [name] insulin aspart (NOVOLOG) 100 UNIT/ML FlexPen Inject 8-14 Units into the skin 3 (three) times daily with meals.  [name] levothyroxine (SYNTHROID, LEVOTHROID) 50 MCG tablet Take 1 tablet (50 mcg total) by mouth daily before breakfast.  . MICROLET LANCETS MISC USE TO TEST FOUR TIMES A DAY  . TOUJEO SOLOSTAR 300 UNIT/ML SOPN INJECT 30 UNITS INTO THE SKIN AT BEDTIME.  . [DISCONTINUED] Insulin Lispro (HUMALOG KWIKPEN) 200 UNIT/ML SOPN Inject 8-14 Units into the skin 3 (three) times daily before meals.   No facility-administered encounter medications on file as  of 07/03/2018.     ALLERGIES: Allergies  Allergen Reactions  . Erythromycin     VACCINATION STATUS:  There is no immunization history on file for this patient.  Diabetes  He presents for his follow-up diabetic visit. He has type 1 diabetes mellitus. Onset time: He was diagnosed at approximate age of 41 years. His disease course has been fluctuating. There are no hypoglycemic associated symptoms. Pertinent negatives for hypoglycemia include no confusion, headaches, pallor or seizures. Pertinent negatives for diabetes include no chest pain, no fatigue, no polydipsia, no polyphagia, no polyuria and no weakness. There are no hypoglycemic complications. Symptoms are improving. There are no diabetic complications. Risk factors for coronary artery disease include diabetes mellitus, tobacco exposure and male sex. Current diabetic treatment includes insulin injections. His weight is stable. He is following a generally unhealthy diet. He has not had a previous visit with a dietitian. He participates in exercise intermittently. His home blood glucose  trend is fluctuating minimally. His breakfast blood glucose range is generally 180-200 mg/dl. His lunch blood glucose range is generally 180-200 mg/dl. His dinner blood glucose range is generally 180-200 mg/dl. His bedtime blood glucose range is generally 180-200 mg/dl. His overall blood glucose range is 180-200 mg/dl. (He returns with his logs showing fluctuating blood glucose profile, much less frequent hypoglycemia than before.  He did not go for his previsit labs, has no recent A1c.) An ACE inhibitor/angiotensin II receptor blocker is not being taken.     Review of Systems  Constitutional: Negative for chills, fatigue, fever and unexpected weight change.  HENT: Negative for dental problem, mouth sores and trouble swallowing.   Eyes: Negative for visual disturbance.  Respiratory: Negative for cough, choking, chest tightness, shortness of breath and wheezing.   Cardiovascular: Negative for chest pain, palpitations and leg swelling.  Gastrointestinal: Negative for abdominal distention, abdominal pain, constipation, diarrhea, nausea and vomiting.  Endocrine: Negative for polydipsia, polyphagia and polyuria.  Genitourinary: Negative for dysuria, flank pain, hematuria and urgency.  Musculoskeletal: Negative for back pain, gait problem, myalgias and neck pain.  Skin: Negative for pallor, rash and wound.  Neurological: Negative for seizures, syncope, weakness, numbness and headaches.  Psychiatric/Behavioral: Negative for confusion and dysphoric mood.    Objective:    BP 126/71   Pulse 74   Ht 6' (1.829 m)   Wt 149 lb (67.6 kg)   BMI 20.21 kg/m   Wt Readings from Last 3 Encounters:  07/03/18 149 lb (67.6 kg)  03/27/18 144 lb (65.3 kg)  01/25/18 145 lb (65.8 kg)     Physical Exam  Constitutional: He is oriented to person, place, and time. He appears well-developed. He is cooperative. No distress.  HENT:  Head: Normocephalic and atraumatic.  Eyes: EOM are normal.  Neck: Normal range of  motion. Neck supple. No tracheal deviation present. No thyromegaly present.  Cardiovascular: Normal rate, S1 normal and S2 normal. Exam reveals no gallop.  No murmur heard. Pulses:      Dorsalis pedis pulses are 1+ on the right side, and 1+ on the left side.       Posterior tibial pulses are 1+ on the right side, and 1+ on the left side.  Pulmonary/Chest: Effort normal. No respiratory distress. He has no wheezes.  Abdominal: He exhibits no distension. There is no tenderness. There is no guarding and no CVA tenderness.  Musculoskeletal: He exhibits no edema.       Right shoulder: He exhibits no swelling and no deformity.  Neurological:  He is alert and oriented to person, place, and time. He has normal strength and normal reflexes. No cranial nerve deficit or sensory deficit. Gait normal.  Skin: Skin is warm and dry. No rash noted. No cyanosis. Nails show no clubbing.  Psychiatric: He has a normal mood and affect. His speech is normal. Judgment normal. Cognition and memory are normal.    No results found for this or any previous visit (from the past 2160 hour(s)).    Assessment & Plan:   1. Uncontrolled type 1 diabetes mellitus with hyperglycemia ([place])  - Jose Walters has currently uncontrolled symptomatic type 1 DM since 37   years of age.   -He returns with fluctuating blood glucose profile, missed his previsit labs.  His last A1c was 7.8%, improving from 9.7%.  He will be sent directly to lab for new A1c today.  He has prior history of repeated episodes of diabetic ketoacidosis at least on 4 separate occasions in the past most recent in February 2018.   -his diabetes is complicated by recurrent DKA's, chronic heavy smoking and JAYDON AVINA remains at a high risk for more acute and chronic complications which include CAD, CVA, CKD, retinopathy, and neuropathy. These are all discussed in detail with the patient.  - I have counseled him on diet management  by adopting a carbohydrate  restricted/protein rich diet.  -  Suggestion is made for him to avoid simple carbohydrates  from his diet including Cakes, Sweet Desserts / Pastries, Ice Cream, Soda (diet and regular), Sweet Tea, Candies, Chips, Cookies, Store Bought Juices, Alcohol in Excess of  1-2 drinks a day, Artificial Sweeteners, and "Sugar-free" Products. This will help patient to have stable blood glucose profile and potentially avoid unintended weight gain.  - I encouraged him to switch to  unprocessed or minimally processed complex starch and increased protein intake (animal or plant source), fruits, and vegetables.  - he is advised to stick to a routine mealtimes to eat 3 meals  a day and avoid unnecessary snacks ( to snack only to correct hypoglycemia).   - he has been scheduled with Jearld Fenton, RDN, CDE for individualized diabetes education.  - I have approached him with the following individualized plan to manage diabetes and patient agrees:   - He continued to do better on   standardized basal/bolus insulin regimen.  -He is advised to continue Toujeo 30 units nightly, continue NovoLog  8 units 3 times daily AC  for pre-meal BG readings of 70-112m/dl, plus patient specific correction dose for unexpected hyperglycemia above 1522mdl, associated with strict monitoring of glucose 4 times a day-before meals and at bedtime. - Patient is warned not to take insulin without proper monitoring per orders. -Adjustment parameters are given for hypo and hyperglycemia in writing. -Patient is encouraged to call clinic for blood glucose levels less than 70 or above 200 mg /dl. -He is not a candidate for metformin, SGLT2 inhibitors, nor incretin therapy. - Patient specific target  A1c;  LDL, HDL, Triglycerides, and  Waist Circumference were discussed in detail.  2) BP/HTN: His blood pressure is controlled to target.   He is not on antihypertensive medications. 3) Lipids/HPL:   Recent lipid panel showed uncontrolled LDL at  93, HDL of 64.  He will be observed without statin treatment.   4)  Weight/Diet: He has weight deficit, may benefit from 10 to 15 pound weight gain.  CDE Consult will be initiated , exercise, and detailed carbohydrates information provided.  5) hypothyroidism: New diagnosis for him -He missed his previsit labs.  He is advised to continue levothyroxine 50 mcg p.o. every morning.   - We discussed about correct intake of levothyroxine, at fasting, with water, separated by at least 30 minutes from breakfast, and separated by more than 4 hours from calcium, iron, multivitamins, acid reflux medications (PPIs). -Patient is made aware of the fact that thyroid hormone replacement is needed for life, dose to be adjusted by periodic monitoring of thyroid function tests.   6) Chronic Care/Health Maintenance:  -he   is encouraged to continue to follow up with Ophthalmology, Dentist,  Podiatrist at least yearly or according to recommendations, and he is extensively counseled for smoking cessation.   I have recommended yearly flu vaccine and pneumonia vaccination at least every 5 years; moderate intensity exercise for up to 150 minutes weekly; and  sleep for at least 7 hours a day.  - I advised patient to maintain close follow up with [name], MD for primary care needs.  - Time spent with the patient: 25 min, of which >50% was spent in reviewing his blood glucose logs , discussing his hypo- and hyper-glycemic episodes, reviewing his current and  previous labs and insulin doses and developing a plan to avoid hypo- and hyper-glycemia. Please refer to Patient Instructions for Blood Glucose Monitoring and Insulin/Medications Dosing Guide"  in media tab for additional information. Avie Arenas participated in the discussions, expressed understanding, and voiced agreement with the above plans.  All questions were answered to his satisfaction. he is encouraged to contact clinic should he have any questions or  concerns prior to his return visit.  Follow up plan: - Return in about 3 months (around 10/03/2018) for Meter, and Logs, Follow up with Pre-visit Labs, Meter, and Logs.  Glade Lloyd, MD [facility] [address] [place], [place] [address] Phone: [phone]  Fax: [phone]    07/03/2018, 3:30 PM  This note was partially dictated with voice recognition software. Similar sounding words can be transcribed inadequately or may not  be corrected upon review.

## 2018-07-03 NOTE — Patient Instructions (Signed)

## 2018-07-04 LAB — HGB A1C W/O EAG: Hgb A1c MFr Bld: 7.1 % — ABNORMAL HIGH (ref 4.8–5.6)

## 2018-08-02 ENCOUNTER — Other Ambulatory Visit: Payer: Self-pay | Admitting: "Endocrinology

## 2018-08-03 ENCOUNTER — Other Ambulatory Visit: Payer: Self-pay

## 2018-08-03 MED ORDER — INSULIN ASPART 100 UNIT/ML FLEXPEN: 8.0000 [IU] | PEN_INJECTOR | Freq: Three times a day (TID) | SUBCUTANEOUS | 2 refills | Status: DC

## 2018-09-25 ENCOUNTER — Other Ambulatory Visit: Payer: Self-pay | Admitting: "Endocrinology

## 2018-09-25 DIAGNOSIS — E1065 Type 1 diabetes mellitus with hyperglycemia: Secondary | ICD-10-CM | POA: Diagnosis not present

## 2018-09-26 LAB — TSH: TSH: 5.41 u[IU]/mL — ABNORMAL HIGH (ref 0.450–4.500)

## 2018-09-26 LAB — T4, FREE: Free T4: 1.13 ng/dL (ref 0.82–1.77)

## 2018-09-26 LAB — HGB A1C W/O EAG: Hgb A1c MFr Bld: 7.9 % — ABNORMAL HIGH (ref 4.8–5.6)

## 2018-10-03 ENCOUNTER — Encounter: Payer: Self-pay | Admitting: "Endocrinology

## 2018-10-03 ENCOUNTER — Ambulatory Visit (INDEPENDENT_AMBULATORY_CARE_PROVIDER_SITE_OTHER): Payer: BLUE CROSS/BLUE SHIELD | Admitting: "Endocrinology

## 2018-10-03 VITALS — BP 127/75 | HR 82 | Ht 72.0 in | Wt 149.0 lb

## 2018-10-03 DIAGNOSIS — E039 Hypothyroidism, unspecified: Secondary | ICD-10-CM

## 2018-10-03 DIAGNOSIS — E1065 Type 1 diabetes mellitus with hyperglycemia: Secondary | ICD-10-CM

## 2018-10-03 DIAGNOSIS — F17200 Nicotine dependence, unspecified, uncomplicated: Secondary | ICD-10-CM

## 2018-10-03 MED ORDER — FREESTYLE LIBRE 14 DAY SENSOR MISC: 1.0000 | 2 refills | Status: DC

## 2018-10-03 MED ORDER — LEVOTHYROXINE SODIUM 75 MCG PO TABS: 75.0000 ug | ORAL_TABLET | Freq: Every day | ORAL | 3 refills | Status: DC

## 2018-10-03 MED ORDER — FREESTYLE LIBRE 14 DAY READER DEVI: 1.0000 | Freq: Once | 0 refills | Status: DC

## 2018-10-03 MED ORDER — GLUCAGON (RDNA) 1 MG IJ KIT: 1.0000 mg | PACK | Freq: Once | INTRAMUSCULAR | 12 refills | Status: DC | PRN

## 2018-10-03 MED ORDER — FREESTYLE LIBRE 14 DAY READER DEVI: 1.0000 | Freq: Once | 0 refills | Status: AC

## 2018-10-03 NOTE — Patient Instructions (Signed)

## 2018-10-03 NOTE — Progress Notes (Signed)
Endocrinology follow-up note       10/03/2018, 9:33 AM   Subjective:    Patient ID: Jose Walters, male    DOB: 11-17-1980.  Jose Walters is being seen in follow-up  for management of currently uncontrolled symptomatic type 1 diabetes, hypothyroidism. PMD:   [name], MD.   Past Medical History:  Diagnosis Date  . Diabetes mellitus type I ([place])    History reviewed. No pertinent surgical history. Social History   Socioeconomic History  . Marital status: Married    Spouse name: Not on file  . Number of children: Not on file  . Years of education: Not on file  . Highest education level: Not on file  Occupational History  . Not on file  Social Needs  . Financial resource strain: Not on file  . Food insecurity:    Worry: Not on file    Inability: Not on file  . Transportation needs:    Medical: Not on file    Non-medical: Not on file  Tobacco Use  . Smoking status: Current Every Day Smoker    Packs/day: 1.00    Years: 12.00    Pack years: 12.00  . Smokeless tobacco: Never Used  Substance and Sexual Activity  . Alcohol use: Yes    Comment: Occasional  . Drug use: Never  . Sexual activity: Not on file  Lifestyle  . Physical activity:    Days per week: Not on file    Minutes per session: Not on file  . Stress: Not on file  Relationships  . Social connections:    Talks on phone: Not on file    Gets together: Not on file    Attends religious service: Not on file    Active member of club or organization: Not on file    Attends meetings of clubs or organizations: Not on file    Relationship status: Not on file  Other Topics Concern  . Not on file  Social History Narrative  . Not on file   Outpatient Encounter Medications as of 10/03/2018  Medication Sig  . Continuous Blood Gluc Receiver (FREESTYLE LIBRE 14 DAY READER) DEVI 1 each by Does not apply route once for 1 dose.  .  Continuous Blood Gluc Sensor (FREESTYLE LIBRE 14 DAY SENSOR) MISC Inject 1 each into the skin every 14 (fourteen) days. Use as directed.  [name] glucagon (GLUCAGON EMERGENCY) 1 MG injection Inject 1 mg into the vein once as needed.  . insulin aspart (NOVOLOG) 100 UNIT/ML FlexPen Inject 8-14 Units into the skin 3 (three) times daily with meals.  [name] levothyroxine (SYNTHROID, LEVOTHROID) 75 MCG tablet Take 1 tablet (75 mcg total) by mouth daily before breakfast.  . MICROLET LANCETS MISC USE TO TEST FOUR TIMES A DAY  . TOUJEO SOLOSTAR 300 UNIT/ML SOPN INJECT 30 UNITS INTO THE SKIN AT BEDTIME.  . [DISCONTINUED] Blood Glucose Monitoring Suppl (ONETOUCH VERIO) w/Device KIT 1 each by Does not apply route as needed.  . [DISCONTINUED] Continuous Blood Gluc Receiver (FREESTYLE LIBRE 14 DAY READER) DEVI 1 each by Does not apply route once for 1 dose.  . [  DISCONTINUED] Continuous Blood Gluc Sensor (FREESTYLE LIBRE 14 DAY SENSOR) MISC Inject 1 each into the skin every 14 (fourteen) days. Use as directed.  . [DISCONTINUED] Continuous Blood Gluc Sensor (FREESTYLE LIBRE 14 DAY SENSOR) MISC Inject 1 each into the skin every 14 (fourteen) days. Use as directed.  . [DISCONTINUED] CONTOUR NEXT TEST test strip USE TO TEST FOUR TIMES DAILY.  . [DISCONTINUED] glucagon (GLUCAGON EMERGENCY) 1 MG injection Inject 1 mg into the vein once as needed.  . [DISCONTINUED] levothyroxine (SYNTHROID, LEVOTHROID) 50 MCG tablet TAKE (1) TABLET BY MOUTH EACH MORNING BEFORE BREAKFAST.  . [DISCONTINUED] levothyroxine (SYNTHROID, LEVOTHROID) 75 MCG tablet Take 1 tablet (75 mcg total) by mouth daily before breakfast.   No facility-administered encounter medications on file as of 10/03/2018.     ALLERGIES: Allergies  Allergen Reactions  . Erythromycin     VACCINATION STATUS:  There is no immunization history on file for this patient.  Diabetes  He presents for his follow-up diabetic visit. He has type 1 diabetes mellitus. Onset time: He  was diagnosed at approximate age of 66 years. His disease course has been worsening. Hypoglycemia symptoms include confusion, headaches and sweats. Pertinent negatives for hypoglycemia include no pallor or seizures. Pertinent negatives for diabetes include no chest pain, no fatigue, no polydipsia, no polyphagia, no polyuria and no weakness. Hypoglycemia complications include blackouts. Symptoms are improving. There are no diabetic complications. Risk factors for coronary artery disease include diabetes mellitus, tobacco exposure and male sex. Current diabetic treatment includes insulin injections. His weight is fluctuating minimally. He is following a generally unhealthy diet. He has not had a previous visit with a dietitian. He participates in exercise intermittently. His home blood glucose trend is fluctuating minimally. His breakfast blood glucose range is generally 180-200 mg/dl. His lunch blood glucose range is generally 180-200 mg/dl. His dinner blood glucose range is generally 180-200 mg/dl. His bedtime blood glucose range is generally 180-200 mg/dl. His overall blood glucose range is 180-200 mg/dl. (He returns with A1c of 7.9%, increasing from 7.1%.  His glycemic profile is significant fluctuating, averaging 174 for the last 14 days.  He has rare and random symptomatic hypoglycemic episodes.) An ACE inhibitor/angiotensin II receptor blocker is not being taken.     Review of Systems  Constitutional: Negative for chills, fatigue, fever and unexpected weight change.  HENT: Negative for dental problem, mouth sores and trouble swallowing.   Eyes: Negative for visual disturbance.  Respiratory: Negative for cough, choking, chest tightness, shortness of breath and wheezing.   Cardiovascular: Negative for chest pain, palpitations and leg swelling.  Gastrointestinal: Negative for abdominal distention, abdominal pain, constipation, diarrhea, nausea and vomiting.  Endocrine: Negative for polydipsia,  polyphagia and polyuria.  Genitourinary: Negative for dysuria, flank pain, hematuria and urgency.  Musculoskeletal: Negative for back pain, gait problem, myalgias and neck pain.  Skin: Negative for pallor, rash and wound.  Neurological: Positive for headaches. Negative for seizures, syncope, weakness and numbness.  Psychiatric/Behavioral: Positive for confusion. Negative for dysphoric mood.    Objective:    BP 127/75   Pulse 82   Ht 6' (1.829 m)   Wt 149 lb (67.6 kg)   BMI 20.21 kg/m   Wt Readings from Last 3 Encounters:  10/03/18 149 lb (67.6 kg)  07/03/18 149 lb (67.6 kg)  03/27/18 144 lb (65.3 kg)     Physical Exam Constitutional:      General: He is not in acute distress.    Appearance: He is well-developed.  HENT:  Head: Normocephalic and atraumatic.  Neck:     Musculoskeletal: Normal range of motion and neck supple.     Thyroid: No thyromegaly.     Trachea: No tracheal deviation.  Cardiovascular:     Rate and Rhythm: Normal rate.     Pulses:          Dorsalis pedis pulses are 1+ on the right side and 1+ on the left side.       Posterior tibial pulses are 1+ on the right side and 1+ on the left side.     Heart sounds: S1 normal and S2 normal. No murmur. No gallop.   Pulmonary:     Effort: Pulmonary effort is normal. No respiratory distress.     Breath sounds: No wheezing.  Abdominal:     General: There is no distension.     Tenderness: There is no abdominal tenderness. There is no guarding.  Musculoskeletal:     Right shoulder: He exhibits no swelling and no deformity.  Skin:    General: Skin is warm and dry.     Findings: No rash.     Nails: There is no clubbing.   Neurological:     Mental Status: He is alert and oriented to person, place, and time.     Cranial Nerves: No cranial nerve deficit.     Sensory: No sensory deficit.     Gait: Gait normal.     Deep Tendon Reflexes: Reflexes are normal and symmetric.  Psychiatric:        Speech: Speech  normal.        Behavior: Behavior is cooperative.        Judgment: Judgment normal.     Recent Results (from the past 2160 hour(s))  Hgb A1c w/o eAG     Status: Abnormal   Collection Time: 09/25/18  8:13 AM  Result Value Ref Range   Hgb A1c MFr Bld 7.9 (H) 4.8 - 5.6 %    Comment:          Prediabetes: 5.7 - 6.4          Diabetes: >6.4          Glycemic control for adults with diabetes: <7.0   T4, free     Status: None   Collection Time: 09/25/18  8:13 AM  Result Value Ref Range   Free T4 1.13 0.82 - 1.77 ng/dL  TSH     Status: Abnormal   Collection Time: 09/25/18  8:13 AM  Result Value Ref Range   TSH 5.410 (H) 0.450 - 4.500 uIU/mL   Lipid Panel     Component Value Date/Time   CHOL 172 03/21/2018   TRIG 73 03/21/2018   HDL 64 03/21/2018   LDLCALC 93 03/21/2018      Assessment & Plan:   1. Uncontrolled type 1 diabetes mellitus with hyperglycemia ([place])  - Jose Walters has currently uncontrolled symptomatic type 1 DM since 38   years of age.   -He returns with fluctuating blood glucose profile, with rare and random hypoglycemic episodes.  He is previsit labs show A1c of 7.9% increasing from 7.1%, although generally improving from 9.7%.    He has prior history of repeated episodes of diabetic ketoacidosis at least on 4 separate occasions in the past most recent in February 2018.   -his diabetes is complicated by recurrent DKA's, chronic heavy smoking and Jose Walters remains at a high risk for more acute and chronic complications which include CAD,  CVA, CKD, retinopathy, and neuropathy. These are all discussed in detail with the patient.  - I have counseled him on diet management  by adopting a carbohydrate restricted/protein rich diet.  -  Suggestion is made for him to avoid simple carbohydrates  from his diet including Cakes, Sweet Desserts / Pastries, Ice Cream, Soda (diet and regular), Sweet Tea, Candies, Chips, Cookies, Store Bought Juices, Alcohol in Excess of   1-2 drinks a day, Artificial Sweeteners, and "Sugar-free" Products. This will help patient to have stable blood glucose profile and potentially avoid unintended weight gain.   - I encouraged him to switch to  unprocessed or minimally processed complex starch and increased protein intake (animal or plant source), fruits, and vegetables.  - he is advised to stick to a routine mealtimes to eat 3 meals  a day and avoid unnecessary snacks ( to snack only to correct hypoglycemia).   - he has been scheduled with Jearld Fenton, RDN, CDE for individualized diabetes education.  - I have approached him with the following individualized plan to manage diabetes and patient agrees:   - He continues to benefit from intensive treatment with basal/bolus insulin.   -He is advised to continue Toujeo 30 units nightly, continue NovoLog  8 units 3 times daily AC  for pre-meal BG readings of 90- 179m/dl, plus patient specific correction dose for unexpected hyperglycemia above 1545mdl, associated with strict monitoring of glucose 4 times a day-before meals and at bedtime. - Patient is warned not to take insulin without proper monitoring per orders. -Adjustment parameters are given for hypo and hyperglycemia in writing. -Patient is encouraged to call clinic for blood glucose levels less than 70 or above 200 mg /dl. -I discussed and prescribed glucagon emergency kit for him. -He is not a candidate for metformin, SGLT2 inhibitors, nor incretin therapy. - Patient specific target  A1c;  LDL, HDL, Triglycerides, and  Waist Circumference were discussed in detail.  2) BP/HTN: His blood pressure is controlled to target.   He is not on antihypertensive medications. 3) Lipids/HPL:   Recent lipid panel showed uncontrolled LDL at 93, HDL of 64.  He will be observed without statin treatment.   4)  Weight/Diet: He has weight deficit, may benefit from 10 to 15 pound weight gain.  CDE Consult will be initiated , exercise, and  detailed carbohydrates information provided.  5) hypothyroidism: -His previsit labs show improved thyroid function tests.  He will continue to benefit from higher dose of levothyroxine.  I discussed and increase his levothyroxine to 75 mcg p.o. every morning.    - We discussed about the correct intake of his thyroid hormone, on empty stomach at fasting, with water, separated by at least 30 minutes from breakfast and other medications,  and separated by more than 4 hours from calcium, iron, multivitamins, acid reflux medications (PPIs). -Patient is made aware of the fact that thyroid hormone replacement is needed for life, dose to be adjusted by periodic monitoring of thyroid function tests.    6) Chronic Care/Health Maintenance:  -he   is encouraged to continue to follow up with Ophthalmology, Dentist,  Podiatrist at least yearly or according to recommendations, and he is extensively counseled for smoking cessation.     I have recommended yearly flu vaccine and pneumonia vaccination at least every 5 years; moderate intensity exercise for up to 150 minutes weekly; and  sleep for at least 7 hours a day.  - I advised patient to maintain close follow up with  [name], MD for primary care needs.  - Time spent with the patient: 25 min, of which >50% was spent in reviewing his blood glucose logs , discussing his hypoglycemia and hyperglycemia episodes, reviewing his current and  previous labs / studies and medications  doses and developing a plan to avoid hypoglycemia and hyperglycemia. Please refer to Patient Instructions for Blood Glucose Monitoring and Insulin/Medications Dosing Guide"  in media tab for additional information. Jose Walters participated in the discussions, expressed understanding, and voiced agreement with the above plans.  All questions were answered to his satisfaction. he is encouraged to contact clinic should he have any questions or concerns prior to his return  visit.   Follow up plan: - Return in about 4 months (around 02/01/2019) for Meter, and Logs, Follow up with Pre-visit Labs, Meter, and Logs.  Glade Lloyd, MD [facility] [address] [place], [place] [address] Phone: [phone]  Fax: [phone]    10/03/2018, 9:33 AM  This note was partially dictated with voice recognition software. Similar sounding words can be transcribed inadequately or may not  be corrected upon review.

## 2018-10-28 IMAGING — US US THYROID
1 series · 14 of 25 positions shown · non-contrast
Comparison: None.

CLINICAL DATA: Hypothyroid.

EXAM:
THYROID ULTRASOUND
TECHNIQUE: Ultrasound examination of the thyroid gland and adjacent soft
tissues was performed.

[Series 1: us thyroid · 14 of 48 slices shown]
[im 1/48]
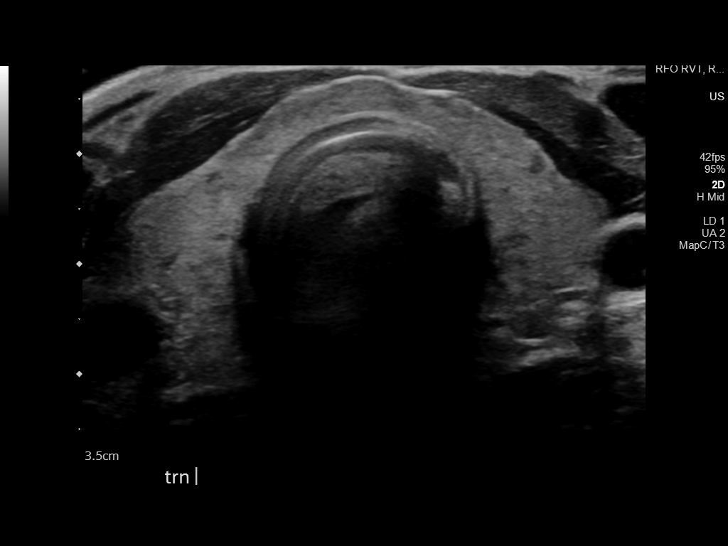
[im 4/48]
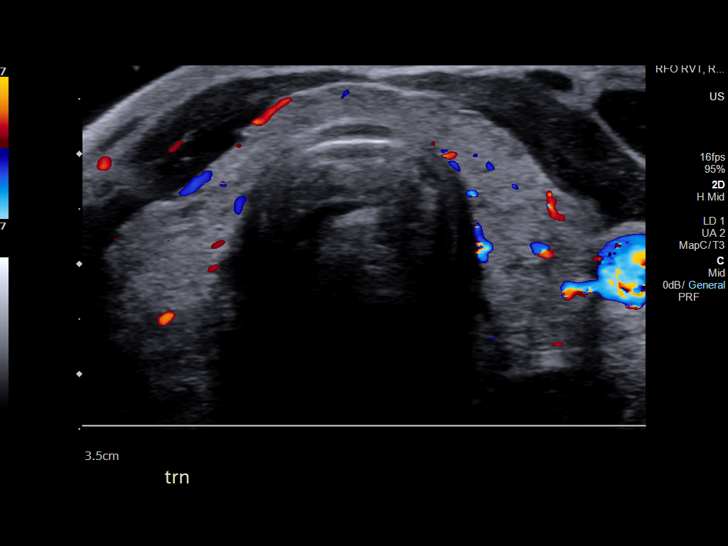
[im 8/48]
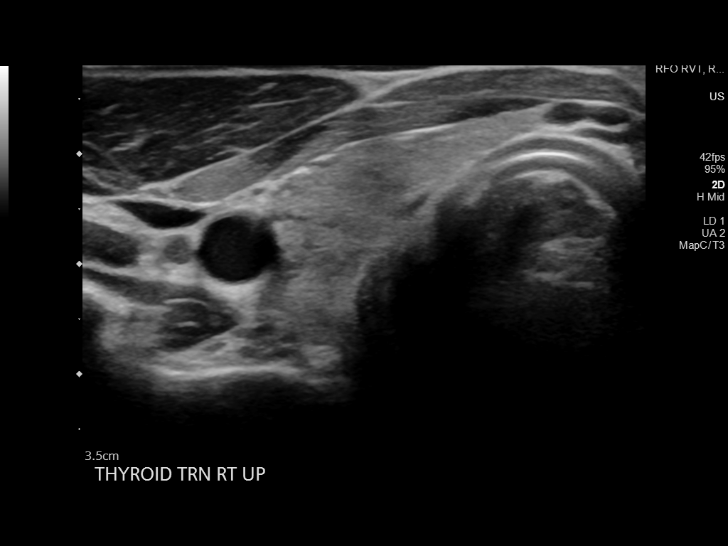
[im 12/48]
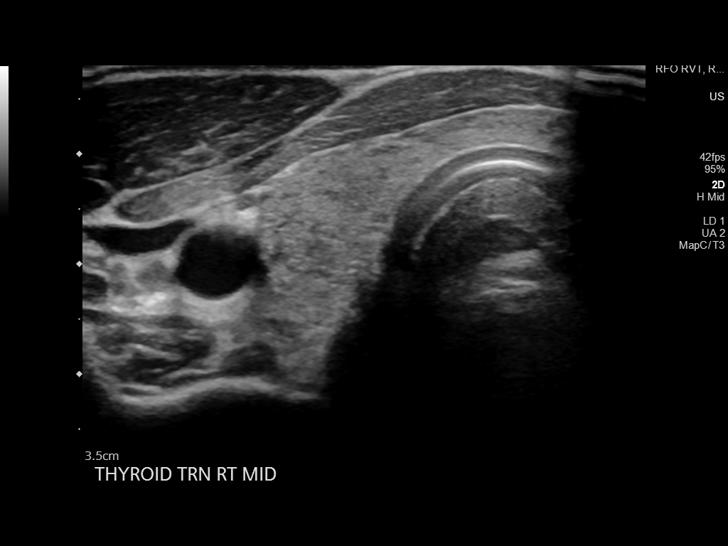
[im 16/48]
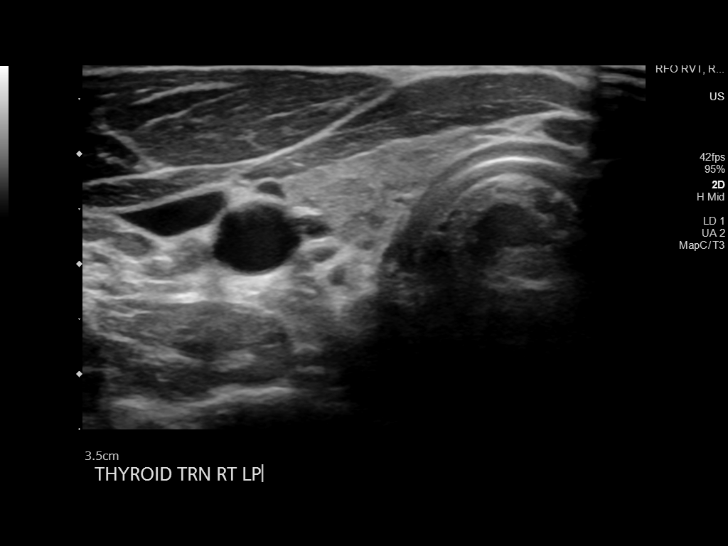
[im 18/48]
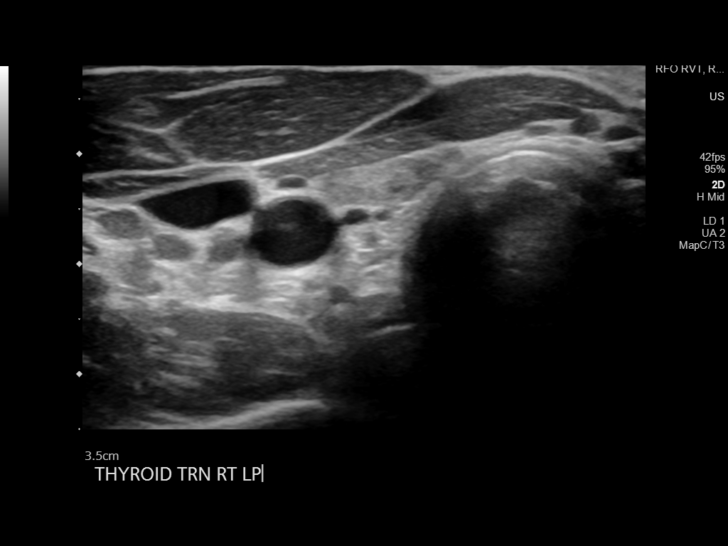
[im 22/48]
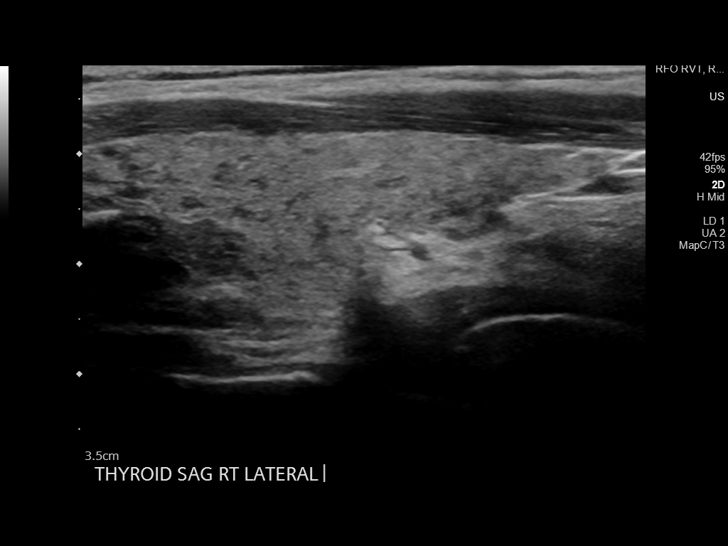
[im 26/48]
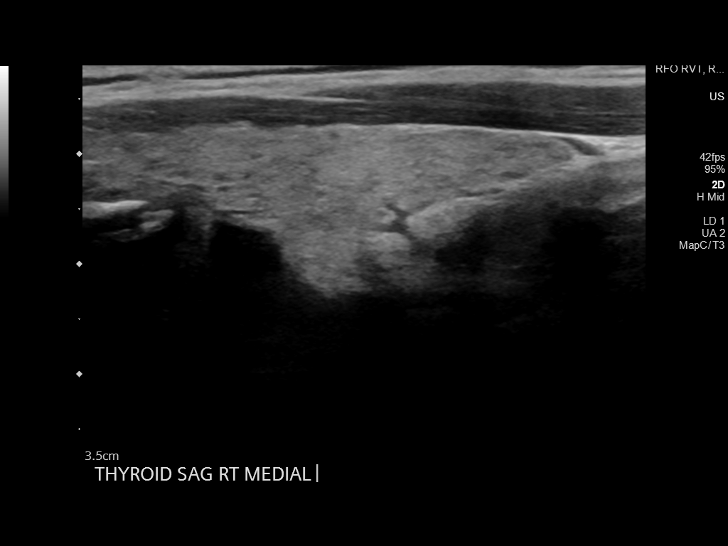
[im 30/48]
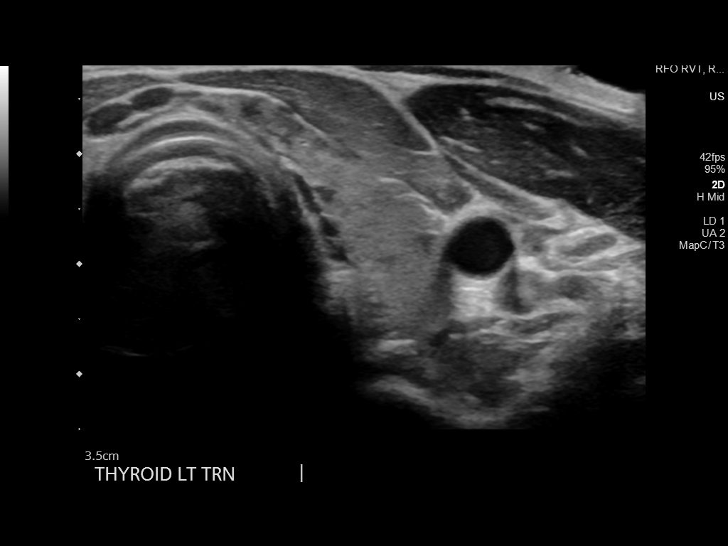
[im 32/48]
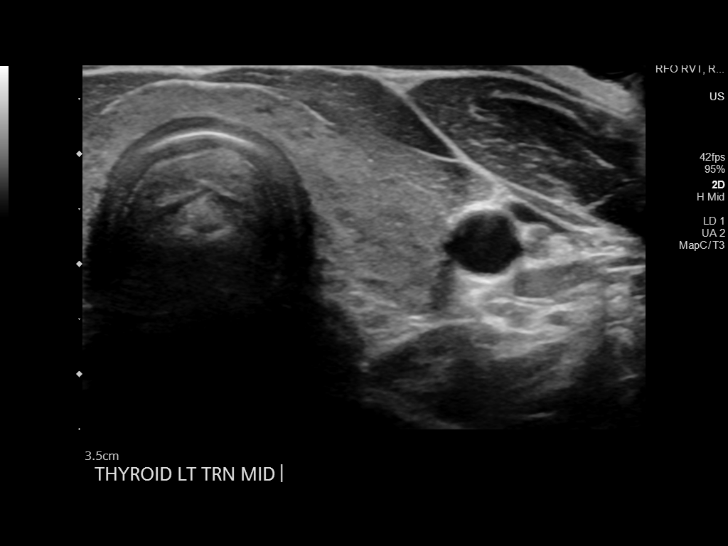
[im 36/48]
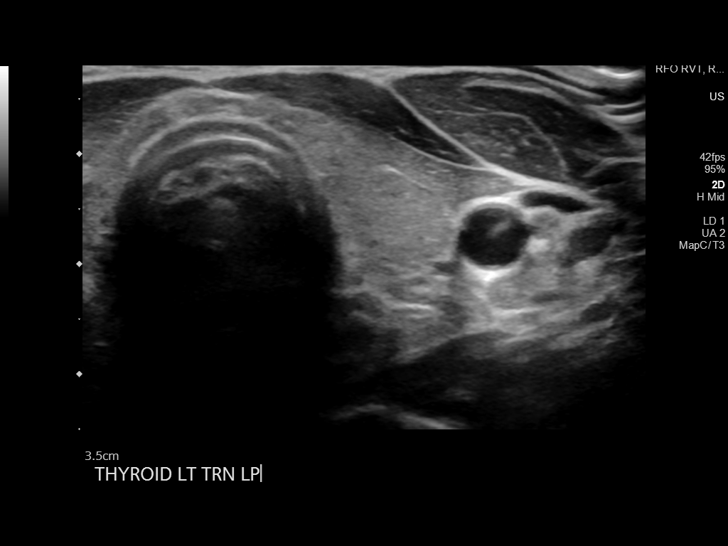
[im 40/48]
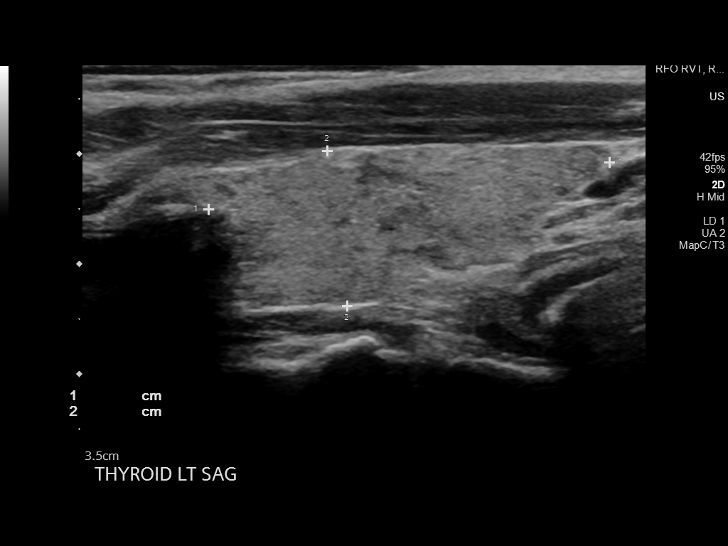
[im 44/48]
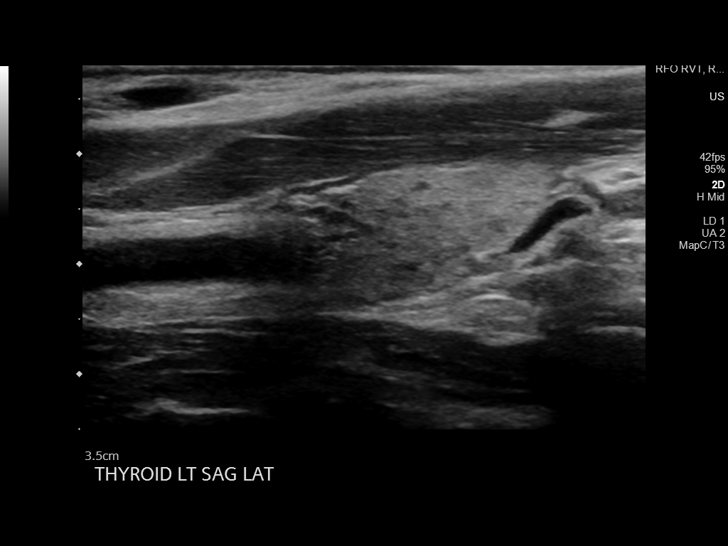
[im 48/48]
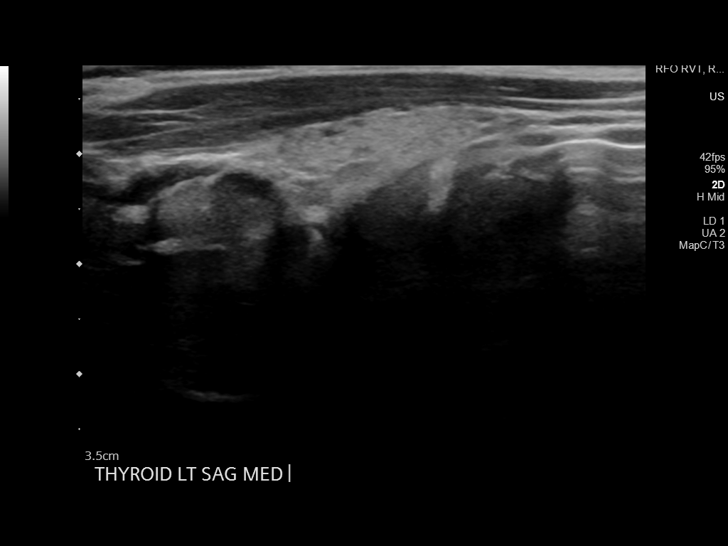

[14 of 25 positions shown; findings below may reference images not displayed]

FINDINGS: Parenchymal Echotexture: Moderately heterogenous

Isthmus: 0.3 cm

Right lobe: 5.1 x 1.6 x 1.3 cm

Left lobe: 3.7 x 1.4 x 1.2 cm

_________________________________________________________

Estimated total number of nodules >/= 1 cm: 0

Number of spongiform nodules >/=  2 cm not described below (TR1): 0

Number of mixed cystic and solid nodules >/= 1.5 cm not described
below (TR2): 0

_________________________________________________________

No discrete nodules are seen within the thyroid gland.
IMPRESSION: No evidence of discrete thyroid nodule.

The above is in keeping with the ACR TI-RADS recommendations - [HOSPITAL] 5963;[DATE].

## 2018-12-12 ENCOUNTER — Other Ambulatory Visit: Payer: Self-pay | Admitting: "Endocrinology

## 2019-02-05 ENCOUNTER — Ambulatory Visit: Payer: BLUE CROSS/BLUE SHIELD | Admitting: "Endocrinology

## 2019-02-26 ENCOUNTER — Other Ambulatory Visit: Payer: Self-pay | Admitting: "Endocrinology

## 2019-02-26 DIAGNOSIS — E039 Hypothyroidism, unspecified: Secondary | ICD-10-CM | POA: Diagnosis not present

## 2019-02-26 DIAGNOSIS — E1065 Type 1 diabetes mellitus with hyperglycemia: Secondary | ICD-10-CM | POA: Diagnosis not present

## 2019-02-27 LAB — COMPREHENSIVE METABOLIC PANEL
ALT: 30 IU/L (ref 0–44)
AST: 20 IU/L (ref 0–40)
Albumin/Globulin Ratio: 2.4 — ABNORMAL HIGH (ref 1.2–2.2)
Albumin: 4.7 g/dL (ref 4.0–5.0)
Alkaline Phosphatase: 65 IU/L (ref 39–117)
BUN/Creatinine Ratio: 19 (ref 9–20)
BUN: 16 mg/dL (ref 6–20)
Bilirubin Total: 0.5 mg/dL (ref 0.0–1.2)
CO2: 23 mmol/L (ref 20–29)
Calcium: 10 mg/dL (ref 8.7–10.2)
Chloride: 101 mmol/L (ref 96–106)
Creatinine, Ser: 0.83 mg/dL (ref 0.76–1.27)
GFR calc Af Amer: 130 mL/min/{1.73_m2} (ref >59)
GFR calc non Af Amer: 112 mL/min/{1.73_m2} (ref >59)
Globulin, Total: 2 g/dL (ref 1.5–4.5)
Glucose: 226 mg/dL — ABNORMAL HIGH (ref 65–99)
Potassium: 4.7 mmol/L (ref 3.5–5.2)
Sodium: 140 mmol/L (ref 134–144)
Total Protein: 6.7 g/dL (ref 6.0–8.5)

## 2019-02-27 LAB — TSH: TSH: <0.006 u[IU]/mL — ABNORMAL LOW (ref 0.450–4.500)

## 2019-02-27 LAB — HGB A1C W/O EAG: Hgb A1c MFr Bld: 8.5 % — ABNORMAL HIGH (ref 4.8–5.6)

## 2019-02-27 LAB — T4, FREE: Free T4: 1.4 ng/dL (ref 0.82–1.77)

## 2019-02-27 LAB — SPECIMEN STATUS REPORT

## 2019-02-28 ENCOUNTER — Other Ambulatory Visit: Payer: Self-pay | Admitting: "Endocrinology

## 2019-03-05 ENCOUNTER — Encounter: Payer: Self-pay | Admitting: "Endocrinology

## 2019-03-05 ENCOUNTER — Ambulatory Visit (INDEPENDENT_AMBULATORY_CARE_PROVIDER_SITE_OTHER): Payer: BC Managed Care – PPO | Admitting: "Endocrinology

## 2019-03-05 ENCOUNTER — Other Ambulatory Visit: Payer: Self-pay

## 2019-03-05 VITALS — BP 126/85 | HR 96 | Ht 72.0 in | Wt 146.0 lb

## 2019-03-05 DIAGNOSIS — E039 Hypothyroidism, unspecified: Secondary | ICD-10-CM | POA: Diagnosis not present

## 2019-03-05 DIAGNOSIS — E1065 Type 1 diabetes mellitus with hyperglycemia: Secondary | ICD-10-CM | POA: Diagnosis not present

## 2019-03-05 MED ORDER — LEVOTHYROXINE SODIUM 50 MCG PO TABS: 50.0000 ug | ORAL_TABLET | Freq: Every day | ORAL | 6 refills | Status: DC

## 2019-03-05 MED ORDER — DEXCOM G6 RECEIVER DEVI: 1.0000 | 0 refills | Status: DC | PRN

## 2019-03-05 MED ORDER — DEXCOM G6 SENSOR MISC: 4.0000 | 2 refills | Status: DC

## 2019-03-05 MED ORDER — TOUJEO SOLOSTAR 300 UNIT/ML ~~LOC~~ SOPN: 34.0000 [IU] | PEN_INJECTOR | Freq: Every day | SUBCUTANEOUS | 6 refills | Status: DC

## 2019-03-05 NOTE — Progress Notes (Signed)
Endocrinology follow-up note       03/05/2019, 5:04 PM   Subjective:    Patient ID: Jose Walters, male    DOB: 1981/08/15.  Jose Walters is being seen in follow-up  for management of currently uncontrolled symptomatic type 1 diabetes, hypothyroidism. PMD:   [name], MD.   Past Medical History:  Diagnosis Date  . Diabetes mellitus type I ([place])    History reviewed. No pertinent surgical history. Social History   Socioeconomic History  . Marital status: Married    Spouse name: Not on file  . Number of children: Not on file  . Years of education: Not on file  . Highest education level: Not on file  Occupational History  . Not on file  Social Needs  . Financial resource strain: Not on file  . Food insecurity    Worry: Not on file    Inability: Not on file  . Transportation needs    Medical: Not on file    Non-medical: Not on file  Tobacco Use  . Smoking status: Current Every Day Smoker    Packs/day: 1.00    Years: 12.00    Pack years: 12.00  . Smokeless tobacco: Never Used  Substance and Sexual Activity  . Alcohol use: Yes    Comment: Occasional  . Drug use: Never  . Sexual activity: Not on file  Lifestyle  . Physical activity    Days per week: Not on file    Minutes per session: Not on file  . Stress: Not on file  Relationships  . Social Herbalist on phone: Not on file    Gets together: Not on file    Attends religious service: Not on file    Active member of club or organization: Not on file    Attends meetings of clubs or organizations: Not on file    Relationship status: Not on file  Other Topics Concern  . Not on file  Social History Narrative  . Not on file   Outpatient Encounter Medications as of 03/05/2019  Medication Sig  . Continuous Blood Gluc Receiver (DEXCOM G6 RECEIVER) DEVI 1 Piece by Does not apply route as needed.  . Continuous Blood  Gluc Sensor (DEXCOM G6 SENSOR) MISC 4 Pieces by Does not apply route once a week.  . Continuous Blood Gluc Sensor (FREESTYLE LIBRE 14 DAY SENSOR) MISC Inject 1 each into the skin every 14 (fourteen) days. Use as directed.  [name] glucagon (GLUCAGON EMERGENCY) 1 MG injection Inject 1 mg into the vein once as needed.  . insulin aspart (NOVOLOG) 100 UNIT/ML FlexPen Inject 8-14 Units into the skin 3 (three) times daily with meals.  . Insulin Glargine, 1 Unit Dial, (TOUJEO SOLOSTAR) 300 UNIT/ML SOPN Inject 34 Units into the skin at bedtime.  [name] levothyroxine (SYNTHROID) 50 MCG tablet Take 1 tablet (50 mcg total) by mouth daily before breakfast.  . MICROLET LANCETS MISC USE TO TEST FOUR TIMES A DAY  . [DISCONTINUED] levothyroxine (SYNTHROID) 75 MCG tablet TAKE (1) TABLET BY MOUTH EACH MORNING BEFORE BREAKFAST.  . [DISCONTINUED] TOUJEO SOLOSTAR 300 UNIT/ML SOPN INJECT  30 UNITS INTO THE SKIN AT BEDTIME.   No facility-administered encounter medications on file as of 03/05/2019.     ALLERGIES: Allergies  Allergen Reactions  . Erythromycin     VACCINATION STATUS:  There is no immunization history on file for this patient.  Diabetes He presents for his follow-up diabetic visit. He has type 1 diabetes mellitus. Onset time: He was diagnosed at approximate age of 68 years. His disease course has been worsening. Pertinent negatives for hypoglycemia include no confusion, headaches, pallor, seizures or sweats. Pertinent negatives for diabetes include no chest pain, no fatigue, no polydipsia, no polyphagia, no polyuria and no weakness. Pertinent negatives for hypoglycemia complications include no blackouts. Symptoms are worsening. There are no diabetic complications. Risk factors for coronary artery disease include diabetes mellitus, tobacco exposure and male sex. Current diabetic treatment includes insulin injections. His weight is decreasing steadily. He is following a generally unhealthy diet. He has not had a  previous visit with a dietitian. He participates in exercise intermittently. His home blood glucose trend is fluctuating minimally. His breakfast blood glucose range is generally 180-200 mg/dl. His lunch blood glucose range is generally 180-200 mg/dl. His dinner blood glucose range is generally 180-200 mg/dl. His bedtime blood glucose range is generally 180-200 mg/dl. His overall blood glucose range is 180-200 mg/dl. (He returns with A1c of 8.5%,  increasing from his last visit A1c of 7.1%, although generally improving.    ) An ACE inhibitor/angiotensin II receptor blocker is not being taken.     Review of Systems  Constitutional: Negative for chills, fatigue, fever and unexpected weight change.  HENT: Negative for dental problem, mouth sores and trouble swallowing.   Eyes: Negative for visual disturbance.  Respiratory: Negative for cough, choking, chest tightness, shortness of breath and wheezing.   Cardiovascular: Negative for chest pain, palpitations and leg swelling.  Gastrointestinal: Negative for abdominal distention, abdominal pain, constipation, diarrhea, nausea and vomiting.  Endocrine: Negative for polydipsia, polyphagia and polyuria.  Genitourinary: Negative for dysuria, flank pain, hematuria and urgency.  Musculoskeletal: Negative for back pain, gait problem, myalgias and neck pain.  Skin: Negative for pallor, rash and wound.  Neurological: Negative for seizures, syncope, weakness, numbness and headaches.  Psychiatric/Behavioral: Negative for confusion and dysphoric mood.    Objective:    BP 126/85   Pulse 96   Ht 6' (1.829 m)   Wt 146 lb (66.2 kg)   BMI 19.80 kg/m   Wt Readings from Last 3 Encounters:  03/05/19 146 lb (66.2 kg)  10/03/18 149 lb (67.6 kg)  07/03/18 149 lb (67.6 kg)     Physical Exam Constitutional:      General: He is not in acute distress.    Appearance: He is well-developed.  HENT:     Head: Normocephalic and atraumatic.  Neck:      Musculoskeletal: Normal range of motion and neck supple.     Thyroid: No thyromegaly.     Trachea: No tracheal deviation.  Cardiovascular:     Rate and Rhythm: Normal rate.     Pulses:          Dorsalis pedis pulses are 1+ on the right side and 1+ on the left side.       Posterior tibial pulses are 1+ on the right side and 1+ on the left side.     Heart sounds: S1 normal and S2 normal. No murmur. No gallop.   Pulmonary:     Effort: Pulmonary effort is normal. No respiratory  distress.     Breath sounds: No wheezing.  Abdominal:     General: There is no distension.     Tenderness: There is no abdominal tenderness. There is no guarding.  Musculoskeletal:     Right shoulder: He exhibits no swelling and no deformity.  Skin:    General: Skin is warm and dry.     Findings: No rash.     Nails: There is no clubbing.   Neurological:     Mental Status: He is alert and oriented to person, place, and time.     Cranial Nerves: No cranial nerve deficit.     Sensory: No sensory deficit.     Gait: Gait normal.     Deep Tendon Reflexes: Reflexes are normal and symmetric.  Psychiatric:        Speech: Speech normal.        Behavior: Behavior is cooperative.        Judgment: Judgment normal.     Recent Results (from the past 2160 hour(s))  Comprehensive metabolic panel     Status: Abnormal   Collection Time: 02/26/19  9:24 AM  Result Value Ref Range   Glucose 226 (H) 65 - 99 mg/dL   BUN 16 6 - 20 mg/dL   Creatinine, Ser 0.83 0.76 - 1.27 mg/dL   GFR calc non Af Amer 112 >59 mL/min/1.73   GFR calc Af Amer 130 >59 mL/min/1.73   BUN/Creatinine Ratio 19 9 - 20   Sodium 140 134 - 144 mmol/L   Potassium 4.7 3.5 - 5.2 mmol/L   Chloride 101 96 - 106 mmol/L   CO2 23 20 - 29 mmol/L   Calcium 10.0 8.7 - 10.2 mg/dL   Total Protein 6.7 6.0 - 8.5 g/dL   Albumin 4.7 4.0 - 5.0 g/dL   Globulin, Total 2.0 1.5 - 4.5 g/dL   Albumin/Globulin Ratio 2.4 (H) 1.2 - 2.2   Bilirubin Total 0.5 0.0 - 1.2 mg/dL    Alkaline Phosphatase 65 39 - 117 IU/L   AST 20 0 - 40 IU/L   ALT 30 0 - 44 IU/L  Hgb A1c w/o eAG     Status: Abnormal   Collection Time: 02/26/19  9:24 AM  Result Value Ref Range   Hgb A1c MFr Bld 8.5 (H) 4.8 - 5.6 %    Comment:          Prediabetes: 5.7 - 6.4          Diabetes: >6.4          Glycemic control for adults with diabetes: <7.0   T4, free     Status: None   Collection Time: 02/26/19  9:24 AM  Result Value Ref Range   Free T4 1.40 0.82 - 1.77 ng/dL  TSH     Status: Abnormal   Collection Time: 02/26/19  9:24 AM  Result Value Ref Range   TSH <0.006 (L) 0.450 - 4.500 uIU/mL  Specimen status report     Status: None   Collection Time: 02/26/19  9:24 AM  Result Value Ref Range   specimen status report Comment     Comment: Jose Walters CMP14 Default Ambig Abbrev CMP14 Default A hand-written panel/profile was received from your office. In accordance with the [facility] Ambiguous Test Code Policy dated July 9811, we have completed your order by using the closest currently or formerly recognized AMA panel.  We have assigned Comprehensive Metabolic Panel (14), Test Code #322000 to this request.  If this is not  the testing you wished to receive on this specimen, please contact the [facility] Client Inquiry/Technical Services Department to clarify the test order.  We appreciate your business.    Lipid Panel     Component Value Date/Time   CHOL 172 03/21/2018   TRIG 73 03/21/2018   HDL 64 03/21/2018   LDLCALC 93 03/21/2018      Assessment & Plan:   1. Uncontrolled type 1 diabetes mellitus with hyperglycemia ([place])  - Jose Walters has currently uncontrolled symptomatic type 1 DM since 38   years of age.   -He returns with fluctuating blood glucose profile, with rare and random hypoglycemic episodes.  He is previsit labs show A1c of 8.5%, generally improving from 9.7%.     He has prior history of repeated episodes of diabetic ketoacidosis at least on 4 separate occasions  in the past most recent in February 2018.   -his diabetes is complicated by recurrent DKA's, chronic heavy smoking and Jose Walters remains at a high risk for more acute and chronic complications which include CAD, CVA, CKD, retinopathy, and neuropathy. These are all discussed in detail with the patient.  - I have counseled him on diet management  by adopting a carbohydrate restricted/protein rich diet.   -  Suggestion is made for him to avoid simple carbohydrates  from his diet including Cakes, Sweet Desserts / Pastries, Ice Cream, Soda (diet and regular), Sweet Tea, Candies, Chips, Cookies, Sweet Pastries,  Store Bought Juices, Alcohol in Excess of  1-2 drinks a day, Artificial Sweeteners, Coffee Creamer, and "Sugar-free" Products. This will help patient to have stable blood glucose profile and potentially avoid unintended weight gain.   - I encouraged him to switch to  unprocessed or minimally processed complex starch and increased protein intake (animal or plant source), fruits, and vegetables.  - he is advised to stick to a routine mealtimes to eat 3 meals  a day and avoid unnecessary snacks ( to snack only to correct hypoglycemia).   - he has been scheduled with Jose Walters, Jose Walters, Jose Walters for individualized diabetes education.  - I have approached him with the following individualized plan to manage diabetes and patient agrees:   - He continues to benefit from intensive treatment with basal/bolus insulin.   -He is advised to increase Toujeo to 34  units nightly, continue NovoLog  8 units 3 times daily AC  for pre-meal BG readings of 90- '150mg'$ /dl, plus patient specific correction dose for unexpected hyperglycemia above '150mg'$ /dl, associated with strict monitoring of glucose 4 times a day-before meals and at bedtime. - Patient is warned not to take insulin without proper monitoring per orders. -Adjustment parameters are given for hypo and hyperglycemia in writing. -Patient is encouraged to  call clinic for blood glucose levels less than 70 or above 200 mg /dl. -I discussed and prescribed glucagon emergency kit for him. -He is not a candidate for metformin, SGLT2 inhibitors, nor incretin therapy. -I discussed and prescribed Dexcom CGM device for him.  He would greatly benefit from this device if his insurance helps him cover the cost. - Patient specific target  A1c;  LDL, HDL, Triglycerides, and  Waist Circumference were discussed in detail.  2) BP/HTN: His blood pressure is controlled to target.  He is not on antihypertensive medications. 3) Lipids/HPL:   Recent lipid panel showed uncontrolled LDL at 93, HDL of 64.  He will be observed without statin treatment.   4)  Weight/Diet: He has weight deficit, may benefit  from 10 to 15 pound weight gain.  Jose Walters Consult will be initiated , exercise, and detailed carbohydrates information provided.  5) hypothyroidism: -His previsit labs show over replacement with levothyroxine.  I discussed and lowered his levothyroxine to 50 mcg p.o. every morning.   - We discussed about the correct intake of his thyroid hormone, on empty stomach at fasting, with water, separated by at least 30 minutes from breakfast and other medications,  and separated by more than 4 hours from calcium, iron, multivitamins, acid reflux medications (PPIs). -Patient is made aware of the fact that thyroid hormone replacement is needed for life, dose to be adjusted by periodic monitoring of thyroid function tests.  6) Chronic Care/Health Maintenance:  -he   is encouraged to continue to follow up with Ophthalmology, Dentist,  Podiatrist at least yearly or according to recommendations, and he is extensively counseled for smoking cessation.     I have recommended yearly flu vaccine and pneumonia vaccination at least every 5 years; moderate intensity exercise for up to 150 minutes weekly; and  sleep for at least 7 hours a day.  - I advised patient to maintain close follow up with  [name], MD for primary care needs.  - Patient Care Time Today:  25 min, of which >50% was spent in reviewing his  current and  previous labs/studies, previous treatments, blood glucose logs, and medications doses and developing a plan for long-term care based on the latest recommendations for standards of care.  Avie Arenas participated in the discussions, expressed understanding, and voiced agreement with the above plans.  All questions were answered to his satisfaction. he is encouraged to contact clinic should he have any questions or concerns prior to his return visit.    Follow up plan: - Return in about 6 months (around 09/05/2019) for Follow up with Pre-visit Labs, Meter, and Logs.  Glade Lloyd, MD [facility] [address] [place], [place] [address] Phone: [phone]  Fax: [phone]    03/05/2019, 5:04 PM  This note was partially dictated with voice recognition software. Similar sounding words can be transcribed inadequately or may not  be corrected upon review.

## 2019-03-18 ENCOUNTER — Other Ambulatory Visit: Payer: Self-pay | Admitting: "Endocrinology

## 2019-06-27 ENCOUNTER — Other Ambulatory Visit: Payer: Self-pay | Admitting: "Endocrinology

## 2019-08-24 ENCOUNTER — Other Ambulatory Visit: Payer: Self-pay | Admitting: "Endocrinology

## 2019-09-10 ENCOUNTER — Ambulatory Visit: Payer: BC Managed Care – PPO | Admitting: "Endocrinology

## 2019-09-10 ENCOUNTER — Telehealth: Payer: Self-pay | Admitting: "Endocrinology

## 2019-09-10 DIAGNOSIS — E039 Hypothyroidism, unspecified: Secondary | ICD-10-CM

## 2019-09-10 DIAGNOSIS — E1065 Type 1 diabetes mellitus with hyperglycemia: Secondary | ICD-10-CM

## 2019-09-10 NOTE — Telephone Encounter (Signed)
Lab orders sent

## 2019-09-10 NOTE — Telephone Encounter (Signed)
Can you update his lab order, he pushed appt out until march and it will expire by then

## 2019-10-29 ENCOUNTER — Other Ambulatory Visit: Payer: Self-pay | Admitting: "Endocrinology

## 2019-10-29 DIAGNOSIS — E039 Hypothyroidism, unspecified: Secondary | ICD-10-CM | POA: Diagnosis not present

## 2019-10-29 DIAGNOSIS — E1065 Type 1 diabetes mellitus with hyperglycemia: Secondary | ICD-10-CM | POA: Diagnosis not present

## 2019-10-30 LAB — COMPREHENSIVE METABOLIC PANEL
ALT: 22 IU/L (ref 0–44)
AST: 22 IU/L (ref 0–40)
Albumin/Globulin Ratio: 2.3 — ABNORMAL HIGH (ref 1.2–2.2)
Albumin: 4.8 g/dL (ref 4.0–5.0)
Alkaline Phosphatase: 44 IU/L (ref 39–117)
BUN/Creatinine Ratio: 21 — ABNORMAL HIGH (ref 9–20)
BUN: 18 mg/dL (ref 6–20)
Bilirubin Total: 0.3 mg/dL (ref 0.0–1.2)
CO2: 23 mmol/L (ref 20–29)
Calcium: 9.9 mg/dL (ref 8.7–10.2)
Chloride: 97 mmol/L (ref 96–106)
Creatinine, Ser: 0.86 mg/dL (ref 0.76–1.27)
GFR calc Af Amer: 128 mL/min/{1.73_m2} (ref >59)
GFR calc non Af Amer: 111 mL/min/{1.73_m2} (ref >59)
Globulin, Total: 2.1 g/dL (ref 1.5–4.5)
Glucose: 326 mg/dL — ABNORMAL HIGH (ref 65–99)
Potassium: 4.6 mmol/L (ref 3.5–5.2)
Sodium: 139 mmol/L (ref 134–144)
Total Protein: 6.9 g/dL (ref 6.0–8.5)

## 2019-10-30 LAB — SPECIMEN STATUS REPORT

## 2019-10-30 LAB — CALCITRIOL (1,25 DI-OH VIT D): Vit D, 1,25-Dihydroxy: 36 pg/mL (ref 19.9–79.3)

## 2019-10-30 LAB — TSH: TSH: 16.6 u[IU]/mL — ABNORMAL HIGH (ref 0.450–4.500)

## 2019-10-30 LAB — HGB A1C W/O EAG: Hgb A1c MFr Bld: 9.7 % — ABNORMAL HIGH (ref 4.8–5.6)

## 2019-10-30 LAB — T4, FREE: Free T4: 0.83 ng/dL (ref 0.82–1.77)

## 2019-11-13 ENCOUNTER — Ambulatory Visit: Payer: Self-pay | Admitting: "Endocrinology

## 2019-11-13 ENCOUNTER — Other Ambulatory Visit: Payer: Self-pay

## 2019-11-30 ENCOUNTER — Ambulatory Visit (INDEPENDENT_AMBULATORY_CARE_PROVIDER_SITE_OTHER): Payer: BC Managed Care – PPO | Admitting: "Endocrinology

## 2019-11-30 ENCOUNTER — Encounter: Payer: Self-pay | Admitting: "Endocrinology

## 2019-11-30 ENCOUNTER — Other Ambulatory Visit: Payer: Self-pay

## 2019-11-30 VITALS — BP 121/77 | HR 88 | Ht 72.0 in | Wt 157.8 lb

## 2019-11-30 DIAGNOSIS — E1065 Type 1 diabetes mellitus with hyperglycemia: Secondary | ICD-10-CM

## 2019-11-30 DIAGNOSIS — E039 Hypothyroidism, unspecified: Secondary | ICD-10-CM | POA: Diagnosis not present

## 2019-11-30 NOTE — Progress Notes (Signed)
Endocrinology follow-up note       11/30/2019, 9:18 AM   Subjective:    Patient ID: Jose Walters, male    DOB: [birth year].  Jose Walters is being seen in follow-up  for management of currently uncontrolled symptomatic type 1 diabetes, hypothyroidism. PMD:   Jose Mad, MD.   Past Medical History:  Diagnosis Date  . Diabetes mellitus type I ([place])    History reviewed. No pertinent surgical history. Social History   Socioeconomic History  . Marital status: Married    Spouse name: Not on file  . Number of children: Not on file  . Years of education: Not on file  . Highest education level: Not on file  Occupational History  . Not on file  Tobacco Use  . Smoking status: Current Every Day Smoker    Packs/day: 1.00    Years: 12.00    Pack years: 12.00  . Smokeless tobacco: Never Used  Substance and Sexual Activity  . Alcohol use: Yes    Comment: Occasional  . Drug use: Never  . Sexual activity: Not on file  Other Topics Concern  . Not on file  Social History Narrative  . Not on file   Social Determinants of Health   Financial Resource Strain:   . Difficulty of Paying Living Expenses:   Food Insecurity:   . Worried About Charity fundraiser in the Last Year:   . Arboriculturist in the Last Year:   Transportation Needs:   . Film/video editor (Medical):   Jose Walters Lack of Transportation (Non-Medical):   Physical Activity:   . Days of Exercise per Week:   . Minutes of Exercise per Session:   Stress:   . Feeling of Stress :   Social Connections:   . Frequency of Communication with Friends and Family:   . Frequency of Social Gatherings with Friends and Family:   . Attends Religious Services:   . Active Member of Clubs or Organizations:   . Attends Archivist Meetings:   Jose Walters Marital Status:    Outpatient Encounter Medications as of 11/30/2019  Medication Sig  .  Continuous Blood Gluc Receiver (DEXCOM G6 RECEIVER) DEVI 1 Piece by Does not apply route as needed.  . Continuous Blood Gluc Sensor (DEXCOM G6 SENSOR) MISC 4 Pieces by Does not apply route once a week.  . Continuous Blood Gluc Sensor (FREESTYLE LIBRE 14 DAY SENSOR) MISC Inject 1 each into the skin every 14 (fourteen) days. Use as directed.  Jose Walters glucagon (GLUCAGON EMERGENCY) 1 MG injection Inject 1 mg into the vein once as needed.  . Insulin Glargine, 1 Unit Dial, (TOUJEO SOLOSTAR) 300 UNIT/ML SOPN Inject 34 Units into the skin at bedtime.  Jose Walters levothyroxine (SYNTHROID) 50 MCG tablet Take 1 tablet (50 mcg total) by mouth daily before breakfast.  . MICROLET LANCETS MISC USE TO TEST FOUR TIMES A DAY  . NOVOLOG FLEXPEN 100 UNIT/ML FlexPen INJECT 8-14 UNITS INTO THE SKIN 3 TIMES DAILY.   No facility-administered encounter medications on file as of 11/30/2019.    ALLERGIES: Allergies  Allergen Reactions  . Erythromycin  VACCINATION STATUS:  There is no immunization history on file for this patient.  Diabetes He presents for his follow-up diabetic visit. He has type 1 diabetes mellitus. Onset time: He was diagnosed at approximate age of 33 years. His disease course has been improving. Pertinent negatives for hypoglycemia include no confusion, headaches, pallor, seizures or sweats. Pertinent negatives for diabetes include no chest pain, no fatigue, no polydipsia, no polyphagia, no polyuria and no weakness. Pertinent negatives for hypoglycemia complications include no blackouts. Symptoms are improving. There are no diabetic complications. Risk factors for coronary artery disease include diabetes mellitus, tobacco exposure and male sex. Current diabetic treatment includes insulin injections. His weight is decreasing steadily. He is following a generally unhealthy diet. He has not had a previous visit with a dietitian. He participates in exercise intermittently. His home blood glucose trend is fluctuating  minimally. His breakfast blood glucose range is generally >200 mg/dl. His lunch blood glucose range is generally 180-200 mg/dl. His dinner blood glucose range is generally 180-200 mg/dl. His bedtime blood glucose range is generally 180-200 mg/dl. His overall blood glucose range is >200 mg/dl. (He returns with A1c of 8.5%,  increasing from his last visit A1c of 7.1%, although generally improving.    ) An ACE inhibitor/angiotensin II receptor blocker is not being taken.     Review of Systems  Constitutional: Negative for chills, fatigue, fever and unexpected weight change.  HENT: Negative for dental problem, mouth sores and trouble swallowing.   Eyes: Negative for visual disturbance.  Respiratory: Negative for cough, choking, chest tightness, shortness of breath and wheezing.   Cardiovascular: Negative for chest pain, palpitations and leg swelling.  Gastrointestinal: Negative for abdominal distention, abdominal pain, constipation, diarrhea, nausea and vomiting.  Endocrine: Negative for polydipsia, polyphagia and polyuria.  Genitourinary: Negative for dysuria, flank pain, hematuria and urgency.  Musculoskeletal: Negative for back pain, gait problem, myalgias and neck pain.  Skin: Negative for pallor, rash and wound.  Neurological: Negative for seizures, syncope, weakness, numbness and headaches.  Psychiatric/Behavioral: Negative for confusion and dysphoric mood.    Objective:    BP 121/77   Pulse 88   Ht 6' (1.829 m)   Wt 157 lb 12.8 oz (71.6 kg)   BMI 21.40 kg/m   Wt Readings from Last 3 Encounters:  11/30/19 157 lb 12.8 oz (71.6 kg)  03/05/19 146 lb (66.2 kg)  10/03/18 149 lb (67.6 kg)     Physical Exam Constitutional:      General: He is not in acute distress.    Appearance: He is well-developed.  HENT:     Head: Normocephalic and atraumatic.  Neck:     Thyroid: No thyromegaly.     Trachea: No tracheal deviation.  Cardiovascular:     Rate and Rhythm: Normal rate.      Pulses:          Dorsalis pedis pulses are 1+ on the right side and 1+ on the left side.       Posterior tibial pulses are 1+ on the right side and 1+ on the left side.     Heart sounds: S1 normal and S2 normal. No murmur. No gallop.   Pulmonary:     Effort: Pulmonary effort is normal. No respiratory distress.     Breath sounds: No wheezing.  Abdominal:     General: There is no distension.     Tenderness: There is no abdominal tenderness. There is no guarding.  Musculoskeletal:     Right shoulder:  No swelling or deformity.     Cervical back: Normal range of motion and neck supple.  Skin:    General: Skin is warm and dry.     Findings: No rash.     Nails: There is no clubbing.  Neurological:     Mental Status: He is alert and oriented to person, place, and time.     Cranial Nerves: No cranial nerve deficit.     Sensory: No sensory deficit.     Gait: Gait normal.     Deep Tendon Reflexes: Reflexes are normal and symmetric.  Psychiatric:        Speech: Speech normal.        Behavior: Behavior is cooperative.        Judgment: Judgment normal.     Recent Results (from the past 2160 hour(s))  Comprehensive metabolic panel     Status: Abnormal   Collection Time: 10/29/19 11:00 AM  Result Value Ref Range   Glucose 326 (H) 65 - 99 mg/dL   BUN 18 6 - 20 mg/dL   Creatinine, Ser 0.86 0.76 - 1.27 mg/dL   GFR calc non Af Amer 111 >59 mL/min/1.73   GFR calc Af Amer 128 >59 mL/min/1.73   BUN/Creatinine Ratio 21 (H) 9 - 20   Sodium 139 134 - 144 mmol/L   Potassium 4.6 3.5 - 5.2 mmol/L   Chloride 97 96 - 106 mmol/L   CO2 23 20 - 29 mmol/L   Calcium 9.9 8.7 - 10.2 mg/dL   Total Protein 6.9 6.0 - 8.5 g/dL   Albumin 4.8 4.0 - 5.0 g/dL   Globulin, Total 2.1 1.5 - 4.5 g/dL   Albumin/Globulin Ratio 2.3 (H) 1.2 - 2.2   Bilirubin Total 0.3 0.0 - 1.2 mg/dL   Alkaline Phosphatase 44 39 - 117 IU/L   AST 22 0 - 40 IU/L   ALT 22 0 - 44 IU/L  Hgb A1c w/o eAG     Status: Abnormal   Collection  Time: 10/29/19 11:00 AM  Result Value Ref Range   Hgb A1c MFr Bld 9.7 (H) 4.8 - 5.6 %    Comment:          Prediabetes: 5.7 - 6.4          Diabetes: >6.4          Glycemic control for adults with diabetes: <7.0   T4, free     Status: None   Collection Time: 10/29/19 11:00 AM  Result Value Ref Range   Free T4 0.83 0.82 - 1.77 ng/dL  TSH     Status: Abnormal   Collection Time: 10/29/19 11:00 AM  Result Value Ref Range   TSH 16.600 (H) 0.450 - 4.500 uIU/mL  Calcitriol (1,25 di-OH Vit D)     Status: None   Collection Time: 10/29/19 11:00 AM  Result Value Ref Range   Vit D, 1,25-Dihydroxy 36.0 19.9 - 79.3 pg/mL  Specimen status report     Status: None   Collection Time: 10/29/19 11:00 AM  Result Value Ref Range   specimen status report Comment     Comment: Isac Caddy CMP14 Default Ambig Abbrev CMP14 Default A hand-written panel/profile was received from your office. In accordance with the [facility] Ambiguous Test Code Policy dated July 2585, we have completed your order by using the closest currently or formerly recognized AMA panel.  We have assigned Comprehensive Metabolic Panel (14), Test Code #322000 to this request.  If this is not the testing you  wished to receive on this specimen, please contact the [facility] Inquiry/Technical Services Department to clarify the test order.  We appreciate your business.    Lipid Panel     Component Value Date/Time   CHOL 172 03/21/2018 0000   TRIG 73 03/21/2018 0000   HDL 64 03/21/2018 0000   LDLCALC 93 03/21/2018 0000      Assessment & Plan:   1. Uncontrolled type 1 diabetes mellitus with hyperglycemia ([place])  - VAYDEN WEINAND has currently uncontrolled symptomatic type 1 DM since 39   years of age.   -He returns with  still fluctuating blood glucose profile, with rare and random hypoglycemic episodes.  He is previsit labs show A1c of 8.5%, generally improving from 9.7%.     He has prior history of repeated episodes of  diabetic ketoacidosis at least on 4 separate occasions in the past most recent in February 2018.   -his diabetes is complicated by recurrent DKA's, chronic heavy smoking and ADRIENE PADULA remains at a high risk for more acute and chronic complications which include CAD, CVA, CKD, retinopathy, and neuropathy. These are all discussed in detail with the patient.  - I have counseled him on diet management  by adopting a carbohydrate restricted/protein rich diet. - he  admits there is a room for improvement in his diet and drink choices. -  Suggestion is made for him to avoid simple carbohydrates  from his diet including Cakes, Sweet Desserts / Pastries, Ice Cream, Soda (diet and regular), Sweet Tea, Candies, Chips, Cookies, Sweet Pastries,  Store Bought Juices, Alcohol in Excess of  1-2 drinks a day, Artificial Sweeteners, Coffee Creamer, and "Sugar-free" Products. This will help patient to have stable blood glucose profile and potentially avoid unintended weight gain.  - I encouraged him to switch to  unprocessed or minimally processed complex starch and increased protein intake (animal or plant source), fruits, and vegetables.  - he is advised to stick to a routine mealtimes to eat 3 meals  a day and avoid unnecessary snacks ( to snack only to correct hypoglycemia).   - he has been scheduled with Jearld Fenton, RDN, CDE for individualized diabetes education.  - I have approached him with the following individualized plan to manage diabetes and patient agrees:   - He continues to benefit from intensive treatment with basal/bolus insulin.   -He worries about early morning hypoglycemic episodes, and would like to avoid further escalation of his insulin doses.  He is willing to stay on Toujeo 34 units nightly, continue NovoLog 8 units 3 times daily AC  for pre-meal BG readings of 90- 139m/dl, plus patient specific correction dose for unexpected hyperglycemia above 158mdl, associated with strict  monitoring of glucose 4 times a day-before meals and at bedtime. - Patient is warned not to take insulin without proper monitoring per orders. -Adjustment parameters are given for hypo and hyperglycemia in writing. -Patient is encouraged to call clinic for blood glucose levels less than 70 or above 200 mg /dl. -He did not pick up his prescribed glucagon kit, plans to do it.    -He is not a candidate for metformin, SGLT2 inhibitors, nor incretin therapy. -I discussed and prescribed Dexcom CGM device for him.  He would greatly benefit from this device if his insurance helps him cover the cost. - Patient specific target  A1c;  LDL, HDL, Triglycerides, and  Waist Circumference were discussed in detail.  2) BP/HTN:  His blood pressure is controlled to target.  3) Lipids/HPL:   Recent lipid panel showed uncontrolled LDL at 93, HDL of 64.  He will be observed without statin treatment.   4)  Weight/Diet: He has weight deficit, gained 11 pounds since last visit, will benefit from further 10 to 15 pounds weight gain.    5) hypothyroidism: -His recent previsit labs were consistent with over replacement.  His levothyroxine was adjusted to 50 mcg p.o. daily during his last visit.      - We discussed about the correct intake of his thyroid hormone, on empty stomach at fasting, with water, separated by at least 30 minutes from breakfast and other medications,  and separated by more than 4 hours from calcium, iron, multivitamins, acid reflux medications (PPIs). -Patient is made aware of the fact that thyroid hormone replacement is needed for life, dose to be adjusted by periodic monitoring of thyroid function tests.   6) Chronic Care/Health Maintenance:  -he   is encouraged to continue to follow up with Ophthalmology, Dentist,  Podiatrist at least yearly or according to recommendations, and he is extensively counseled for smoking cessation.     I have recommended yearly flu vaccine and pneumonia vaccination  at least every 5 years; moderate intensity exercise for up to 150 minutes weekly; and  sleep for at least 7 hours a day.  - I advised patient to maintain close follow up with Jose Mad, MD for primary care needs.  - Time spent on this patient care encounter:  35 min, of which > 50% was spent in  counseling and the rest reviewing his blood glucose logs , discussing his hypoglycemia and hyperglycemia episodes, reviewing his current and  previous labs / studies  ( including abstraction from other facilities) and medications  doses and developing a  long term treatment plan and documenting his care.   Please refer to Patient Instructions for Blood Glucose Monitoring and Insulin/Medications Dosing Guide"  in media tab for additional information. Please  also refer to " Patient Self Inventory" in the Media  tab for reviewed elements of pertinent patient history.  Avie Arenas participated in the discussions, expressed understanding, and voiced agreement with the above plans.  All questions were answered to his satisfaction. he is encouraged to contact clinic should he have any questions or concerns prior to his return visit.    Follow up plan: - Return in about 3 months (around 02/29/2020) for Bring Meter and Logs- A1c in Office, Follow up with Pre-visit Labs.  Glade Lloyd, MD [facility] [address] [place], [place] [address] Phone: [phone]  Fax: [phone]    11/30/2019, 9:18 AM  This note was partially dictated with voice recognition software. Similar sounding words can be transcribed inadequately or may not  be corrected upon review.

## 2019-12-06 ENCOUNTER — Other Ambulatory Visit: Payer: Self-pay | Admitting: "Endocrinology

## 2019-12-07 ENCOUNTER — Other Ambulatory Visit: Payer: Self-pay | Admitting: "Endocrinology

## 2019-12-07 MED ORDER — NOVOLOG FLEXPEN 100 UNIT/ML ~~LOC~~ SOPN: 8.0000 [IU] | PEN_INJECTOR | Freq: Three times a day (TID) | SUBCUTANEOUS | 2 refills | Status: DC

## 2019-12-28 ENCOUNTER — Other Ambulatory Visit: Payer: Self-pay

## 2019-12-28 ENCOUNTER — Ambulatory Visit
Admission: EM | Admit: 2019-12-28 | Discharge: 2019-12-28 | Disposition: A | Discharge status: Home / Self Care | Payer: BC Managed Care – PPO | Attending: Emergency Medicine | Admitting: Emergency Medicine

## 2019-12-28 DIAGNOSIS — L600 Ingrowing nail: Secondary | ICD-10-CM

## 2019-12-28 MED ORDER — DOXYCYCLINE HYCLATE 100 MG PO CAPS: 100.0000 mg | ORAL_CAPSULE | Freq: Two times a day (BID) | ORAL | 0 refills | Status: DC

## 2019-12-28 MED ORDER — MUPIROCIN 2 % EX OINT: 1.0000 "application " | TOPICAL_OINTMENT | Freq: Two times a day (BID) | CUTANEOUS | 0 refills | Status: DC

## 2019-12-28 NOTE — Discharge Instructions (Signed)
Infection: Wash with warm water and mild soap Keep dry and covered Changed dressing at least twice daily Prescribed doxycycline take as directed and to completion Bactroban prescribed.  Use as directed  Ingrown Toenail: Soak foot in warm, soapy water for 10-20 minutes three times a day for one to two weeks, pushing lateral nail fold away from nail plate You may also try placing a cotton wedging or dental floss underneath the lateral nail plate to separate the nail plate form the lateral nail fold Follow up with podiatry if symptoms persists  Return or go to the ED if you have any new or worsening symptoms such as fever, chills, nausea, vomiting, redness, swelling, discharge, worsening symptoms despite medications, etc..[name]

## 2019-12-28 NOTE — ED Triage Notes (Addendum)
Pt was playing with daughter yesterday, took boot off and noticed bleeding to right great toe. Pt denies pain, "but I have neuropathy." Concerned with infection due to diabetes.

## 2019-12-28 NOTE — ED Provider Notes (Signed)
[place]   [id number] 12/28/19 Arrival Time: 1010  CC: Ingrown toenail  SUBJECTIVE:  UZZIAH RIGG is a 39 y.o. male who presents with a RT great toe pain and swelling that he noticed last night.  Denies precipitating event or trauma.  Localizes the symptoms to RT great toe.  Denies pain.  Patient's hx is significant for DM and has peripheral neuropathy.  Has tried cleaning with hydrogen peroxide without relief.  Denies aggravating factors.  Denies similar symptoms.   Denies fever, chills, nausea, vomiting, discharge, SOB, chest pain, changes in bowel or bladder function.    ROS: As per HPI.  All other pertinent ROS negative.     Past Medical History:  Diagnosis Date  . Diabetes mellitus type I ([place])    No past surgical history on file. Allergies  Allergen Reactions  . Erythromycin    No current facility-administered medications on file prior to encounter.   Current Outpatient Medications on File Prior to Encounter  Medication Sig Dispense Refill  . Continuous Blood Gluc Receiver (DEXCOM G6 RECEIVER) DEVI 1 Piece by Does not apply route as needed. 1 Device 0  . Continuous Blood Gluc Sensor (DEXCOM G6 SENSOR) MISC 4 Pieces by Does not apply route once a week. 4 each 2  . Continuous Blood Gluc Sensor (FREESTYLE LIBRE 14 DAY SENSOR) MISC Inject 1 each into the skin every 14 (fourteen) days. Use as directed. 2 each 2  . glucagon (GLUCAGON EMERGENCY) 1 MG injection Inject 1 mg into the vein once as needed. 1 each 12  . insulin aspart (NOVOLOG FLEXPEN) 100 UNIT/ML FlexPen Inject 8-17 Units into the skin 3 (three) times daily with meals. 5 pen 2  . Insulin Glargine, 1 Unit Dial, (TOUJEO SOLOSTAR) 300 UNIT/ML SOPN Inject 34 Units into the skin at bedtime. 4.5 mL 6  . levothyroxine (SYNTHROID) 50 MCG tablet Take 1 tablet (50 mcg total) by mouth daily before breakfast. 30 tablet 6  . MICROLET LANCETS MISC USE TO TEST FOUR TIMES A DAY 150 each 5   Social History    Socioeconomic History  . Marital status: Married    Spouse name: Not on file  . Number of children: Not on file  . Years of education: Not on file  . Highest education level: Not on file  Occupational History  . Not on file  Tobacco Use  . Smoking status: Current Every Day Smoker    Packs/day: 1.00    Years: 12.00    Pack years: 12.00  . Smokeless tobacco: Never Used  Substance and Sexual Activity  . Alcohol use: Yes    Comment: Occasional  . Drug use: Never  . Sexual activity: Not on file  Other Topics Concern  . Not on file  Social History Narrative  . Not on file   Social Determinants of Health   Financial Resource Strain:   . Difficulty of Paying Living Expenses:   Food Insecurity:   . Worried About Charity fundraiser in the Last Year:   . Arboriculturist in the Last Year:   Transportation Needs:   . Film/video editor (Medical):   [name] Lack of Transportation (Non-Medical):   Physical Activity:   . Days of Exercise per Week:   . Minutes of Exercise per Session:   Stress:   . Feeling of Stress :   Social Connections:   . Frequency of Communication with Friends and Family:   . Frequency of Social Gatherings with  Friends and Family:   . Attends Religious Services:   . Active Member of Clubs or Organizations:   . Attends Banker Meetings:   [name] Marital Status:   Intimate Partner Violence:   . Fear of Current or Ex-Partner:   . Emotionally Abused:   [name] Physically Abused:   . Sexually Abused:    Family History  Problem Relation Age of Onset  . Thyroid disease Mother   . Cancer Mother   . Arthritis/Rheumatoid Mother     OBJECTIVE: Vitals:   12/28/19 1020  BP: 132/82  Pulse: 82  Resp: 16  Temp: 97.8 F (36.6 C)  SpO2: 98%    General appearance: alert; no distress Head: NCAT Lungs: Normal respiratory effort CV: Dorsalis pedis pulse 2+ Skin: warm and dry; medial aspect of RT great toe with swelling and erythema, no abscess present,  NTTP, no obvious bleeding or drainage, some granulated tissue present as well Psychological: alert and cooperative; normal mood and affect  ASSESSMENT & PLAN:  1. Ingrown toenail of left foot with infection     Meds ordered this encounter  Medications  . doxycycline (VIBRAMYCIN) 100 MG capsule    Sig: Take 1 capsule (100 mg total) by mouth 2 (two) times daily.    Dispense:  20 capsule    Refill:  0    Order Specific Question:   Supervising Provider    Answer:   Eustace Moore [[id number]]  . mupirocin ointment (BACTROBAN) 2 %    Sig: Apply 1 application topically 2 (two) times daily.    Dispense:  30 g    Refill:  0    Order Specific Question:   Supervising Provider    Answer:   Eustace Moore [[id number]]   Infection: Wash with warm water and mild soap Keep dry and covered Changed dressing at least twice daily Prescribed doxycycline take as directed and to completion Bactroban prescribed.  Use as directed  Ingrown Toenail: Soak foot in warm, soapy water for 10-20 minutes three times a day for one to two weeks, pushing lateral nail fold away from nail plate You may also try placing a cotton wedging or dental floss underneath the lateral nail plate to separate the nail plate form the lateral nail fold Follow up with podiatry if symptoms persists  Return or go to the ED if you have any new or worsening symptoms such as fever, chills, nausea, vomiting, redness, swelling, discharge, worsening symptoms despite medications, etc...  Reviewed expectations re: course of current medical issues. Questions answered. Outlined signs and symptoms indicating need for more acute intervention. Patient verbalized understanding. After Visit Summary given.   Rennis Harding, PA-C 12/28/19 1125

## 2019-12-31 DIAGNOSIS — E1140 Type 2 diabetes mellitus with diabetic neuropathy, unspecified: Secondary | ICD-10-CM | POA: Diagnosis not present

## 2019-12-31 DIAGNOSIS — L600 Ingrowing nail: Secondary | ICD-10-CM | POA: Diagnosis not present

## 2020-01-07 DIAGNOSIS — L600 Ingrowing nail: Secondary | ICD-10-CM | POA: Diagnosis not present

## 2020-01-07 DIAGNOSIS — E1140 Type 2 diabetes mellitus with diabetic neuropathy, unspecified: Secondary | ICD-10-CM | POA: Diagnosis not present

## 2020-01-28 DIAGNOSIS — L600 Ingrowing nail: Secondary | ICD-10-CM | POA: Diagnosis not present

## 2020-01-28 DIAGNOSIS — E1140 Type 2 diabetes mellitus with diabetic neuropathy, unspecified: Secondary | ICD-10-CM | POA: Diagnosis not present

## 2020-02-18 ENCOUNTER — Other Ambulatory Visit: Payer: Self-pay | Admitting: "Endocrinology

## 2020-03-05 ENCOUNTER — Ambulatory Visit: Payer: BC Managed Care – PPO | Admitting: "Endocrinology

## 2020-03-10 ENCOUNTER — Other Ambulatory Visit: Payer: Self-pay | Admitting: "Endocrinology

## 2020-03-11 LAB — COMPREHENSIVE METABOLIC PANEL
ALT: 16 IU/L (ref 0–44)
AST: 15 IU/L (ref 0–40)
Albumin/Globulin Ratio: 2.3 — ABNORMAL HIGH (ref 1.2–2.2)
Albumin: 4.8 g/dL (ref 4.0–5.0)
Alkaline Phosphatase: 35 IU/L — ABNORMAL LOW (ref 48–121)
BUN/Creatinine Ratio: 16 (ref 9–20)
BUN: 15 mg/dL (ref 6–20)
Bilirubin Total: 0.3 mg/dL (ref 0.0–1.2)
CO2: 24 mmol/L (ref 20–29)
Calcium: 10.1 mg/dL (ref 8.7–10.2)
Chloride: 101 mmol/L (ref 96–106)
Creatinine, Ser: 0.95 mg/dL (ref 0.76–1.27)
GFR calc Af Amer: 117 mL/min/{1.73_m2} (ref >59)
GFR calc non Af Amer: 101 mL/min/{1.73_m2} (ref >59)
Globulin, Total: 2.1 g/dL (ref 1.5–4.5)
Glucose: 131 mg/dL — ABNORMAL HIGH (ref 65–99)
Potassium: 4.7 mmol/L (ref 3.5–5.2)
Sodium: 139 mmol/L (ref 134–144)
Total Protein: 6.9 g/dL (ref 6.0–8.5)

## 2020-03-11 LAB — LIPID PANEL W/O CHOL/HDL RATIO
Cholesterol, Total: 195 mg/dL (ref 100–199)
HDL: 65 mg/dL (ref >39)
LDL Chol Calc (NIH): 116 mg/dL — ABNORMAL HIGH (ref 0–99)
Triglycerides: 80 mg/dL (ref 0–149)
VLDL Cholesterol Cal: 14 mg/dL (ref 5–40)

## 2020-03-11 LAB — SPECIMEN STATUS REPORT

## 2020-03-11 LAB — T4, FREE: Free T4: 0.8 ng/dL — ABNORMAL LOW (ref 0.82–1.77)

## 2020-03-11 LAB — TSH: TSH: 9.48 u[IU]/mL — ABNORMAL HIGH (ref 0.450–4.500)

## 2020-03-11 LAB — MICROALBUMIN, URINE: Microalbumin, Urine: 8.3 ug/mL

## 2020-03-18 ENCOUNTER — Ambulatory Visit: Payer: BC Managed Care – PPO | Admitting: "Endocrinology

## 2020-06-02 DIAGNOSIS — S90211A Contusion of right great toe with damage to nail, initial encounter: Secondary | ICD-10-CM | POA: Diagnosis not present

## 2020-06-02 DIAGNOSIS — B999 Unspecified infectious disease: Secondary | ICD-10-CM | POA: Diagnosis not present

## 2020-06-02 DIAGNOSIS — E1140 Type 2 diabetes mellitus with diabetic neuropathy, unspecified: Secondary | ICD-10-CM | POA: Diagnosis not present

## 2020-06-16 DIAGNOSIS — L600 Ingrowing nail: Secondary | ICD-10-CM | POA: Diagnosis not present

## 2020-06-16 DIAGNOSIS — E1140 Type 2 diabetes mellitus with diabetic neuropathy, unspecified: Secondary | ICD-10-CM | POA: Diagnosis not present

## 2020-06-27 ENCOUNTER — Other Ambulatory Visit: Payer: Self-pay | Admitting: "Endocrinology

## 2020-10-08 DIAGNOSIS — E109 Type 1 diabetes mellitus without complications: Secondary | ICD-10-CM | POA: Diagnosis not present

## 2020-10-08 DIAGNOSIS — M79671 Pain in right foot: Secondary | ICD-10-CM | POA: Diagnosis not present

## 2020-10-08 DIAGNOSIS — M25532 Pain in left wrist: Secondary | ICD-10-CM | POA: Diagnosis not present

## 2020-10-08 DIAGNOSIS — Z681 Body mass index (BMI) 19 or less, adult: Secondary | ICD-10-CM | POA: Diagnosis not present

## 2020-10-23 DIAGNOSIS — X58XXXA Exposure to other specified factors, initial encounter: Secondary | ICD-10-CM | POA: Diagnosis not present

## 2020-10-23 DIAGNOSIS — M25432 Effusion, left wrist: Secondary | ICD-10-CM | POA: Diagnosis not present

## 2020-10-23 DIAGNOSIS — S638X2A Sprain of other part of left wrist and hand, initial encounter: Secondary | ICD-10-CM | POA: Diagnosis not present

## 2020-10-23 DIAGNOSIS — S63512A Sprain of carpal joint of left wrist, initial encounter: Secondary | ICD-10-CM | POA: Diagnosis not present

## 2020-10-23 DIAGNOSIS — M25532 Pain in left wrist: Secondary | ICD-10-CM | POA: Diagnosis not present

## 2020-10-23 DIAGNOSIS — M7989 Other specified soft tissue disorders: Secondary | ICD-10-CM | POA: Diagnosis not present

## 2020-10-23 DIAGNOSIS — M6788 Other specified disorders of synovium and tendon, other site: Secondary | ICD-10-CM | POA: Diagnosis not present

## 2020-10-29 DIAGNOSIS — S63502A Unspecified sprain of left wrist, initial encounter: Secondary | ICD-10-CM | POA: Diagnosis not present

## 2020-10-29 DIAGNOSIS — S6990XA Unspecified injury of unspecified wrist, hand and finger(s), initial encounter: Secondary | ICD-10-CM | POA: Diagnosis not present

## 2021-07-20 DIAGNOSIS — E109 Type 1 diabetes mellitus without complications: Secondary | ICD-10-CM | POA: Diagnosis not present

## 2021-07-20 DIAGNOSIS — Z681 Body mass index (BMI) 19 or less, adult: Secondary | ICD-10-CM | POA: Diagnosis not present

## 2021-08-27 ENCOUNTER — Ambulatory Visit: Payer: BC Managed Care – PPO | Admitting: "Endocrinology

## 2021-08-28 ENCOUNTER — Ambulatory Visit: Payer: BC Managed Care – PPO | Admitting: "Endocrinology

## 2021-08-28 ENCOUNTER — Encounter: Payer: Self-pay | Admitting: "Endocrinology

## 2021-08-28 VITALS — BP 98/66 | HR 88 | Ht 73.0 in | Wt 149.0 lb

## 2021-08-28 DIAGNOSIS — E1065 Type 1 diabetes mellitus with hyperglycemia: Secondary | ICD-10-CM

## 2021-08-28 DIAGNOSIS — E039 Hypothyroidism, unspecified: Secondary | ICD-10-CM

## 2021-08-28 MED ORDER — NOVOLOG FLEXPEN 100 UNIT/ML ~~LOC~~ SOPN: 6.0000 [IU] | PEN_INJECTOR | Freq: Three times a day (TID) | SUBCUTANEOUS | 1 refills | Status: AC

## 2021-08-28 MED ORDER — FREESTYLE LIBRE 3 SENSOR MISC: 1.0000 | 2 refills | Status: AC

## 2021-08-28 MED ORDER — LEVOTHYROXINE SODIUM 50 MCG PO TABS: 50.0000 ug | ORAL_TABLET | Freq: Every day | ORAL | 1 refills | Status: AC

## 2021-08-28 MED ORDER — TOUJEO SOLOSTAR 300 UNIT/ML ~~LOC~~ SOPN: 30.0000 [IU] | PEN_INJECTOR | Freq: Every day | SUBCUTANEOUS | 1 refills | Status: AC

## 2021-08-28 NOTE — Patient Instructions (Signed)

## 2021-08-28 NOTE — Progress Notes (Signed)
Endocrinology follow-up note       08/28/2021, 2:20 PM   Subjective:    Patient ID: Jose Walters, male    DOB: April 22, 1981.  Jose Walters is being seen in follow-up  for management of currently uncontrolled symptomatic type 1 diabetes, hypothyroidism. PMD:   [name], MD.   Past Medical History:  Diagnosis Date   Diabetes mellitus type I [facility])    History reviewed. No pertinent surgical history. Social History   Socioeconomic History   Marital status: Married    Spouse name: Not on file   Number of children: Not on file   Years of education: Not on file   Highest education level: Not on file  Occupational History   Not on file  Tobacco Use   Smoking status: Every Day    Packs/day: 1.00    Years: 12.00    Pack years: 12.00    Types: Cigarettes   Smokeless tobacco: Never  Vaping Use   Vaping Use: Never used  Substance and Sexual Activity   Alcohol use: Yes    Comment: Occasional   Drug use: Never   Sexual activity: Not on file  Other Topics Concern   Not on file  Social History Narrative   Not on file   Social Determinants of Health   Financial Resource Strain: Not on file  Food Insecurity: Not on file  Transportation Needs: Not on file  Physical Activity: Not on file  Stress: Not on file  Social Connections: Not on file   Outpatient Encounter Medications as of 08/28/2021  Medication Sig   Continuous Blood Gluc Sensor (FREESTYLE LIBRE 3 SENSOR) MISC 1 Piece by Does not apply route every 14 (fourteen) days. Place 1 sensor on the skin every 14 days. Use to check glucose continuously   insulin aspart (NOVOLOG FLEXPEN) 100 UNIT/ML FlexPen Inject 6-12 Units into the skin 3 (three) times daily with meals.   insulin glargine, 1 Unit Dial, (TOUJEO SOLOSTAR) 300 UNIT/ML Solostar Pen Inject 30 Units into the skin at bedtime.   levothyroxine (SYNTHROID) 50 MCG tablet Take 1 tablet  (50 mcg total) by mouth daily before breakfast.   MICROLET LANCETS MISC USE TO TEST FOUR TIMES A DAY   [DISCONTINUED] Continuous Blood Gluc Receiver (DEXCOM G6 RECEIVER) DEVI 1 Piece by Does not apply route as needed.   [DISCONTINUED] Continuous Blood Gluc Sensor (DEXCOM G6 SENSOR) MISC 4 Pieces by Does not apply route once a week.   [DISCONTINUED] Continuous Blood Gluc Sensor (FREESTYLE LIBRE 14 DAY SENSOR) MISC Inject 1 each into the skin every 14 (fourteen) days. Use as directed.   [DISCONTINUED] doxycycline (VIBRAMYCIN) 100 MG capsule Take 1 capsule (100 mg total) by mouth 2 (two) times daily.   [DISCONTINUED] glucagon (GLUCAGON EMERGENCY) 1 MG injection Inject 1 mg into the vein once as needed.   [DISCONTINUED] insulin aspart (NOVOLOG FLEXPEN) 100 UNIT/ML FlexPen Inject 8-17 Units into the skin 3 (three) times daily with meals. (Patient taking differently: Inject 8-12 Units into the skin 3 (three) times daily with meals.)   [DISCONTINUED] levothyroxine (SYNTHROID) 50 MCG tablet Take 1 tablet (50 mcg total)  by mouth daily before breakfast. (Patient not taking: Reported on 08/28/2021)   [DISCONTINUED] mupirocin ointment (BACTROBAN) 2 % Apply 1 application topically 2 (two) times daily.   [DISCONTINUED] TOUJEO SOLOSTAR 300 UNIT/ML Solostar Pen INJECT 34 UNITS INTO THE SKIN AT BEDTIME.   No facility-administered encounter medications on file as of 08/28/2021.    ALLERGIES: Allergies  Allergen Reactions   Erythromycin     VACCINATION STATUS:  There is no immunization history on file for this patient.  Diabetes He presents for his follow-up diabetic visit. He has type 1 diabetes mellitus. Onset time: He was diagnosed at approximate age of 37 years. His disease course has been worsening. Pertinent negatives for hypoglycemia include no confusion, headaches, pallor, seizures or sweats. Pertinent negatives for diabetes include no chest pain, no fatigue, no polydipsia, no polyphagia, no polyuria  and no weakness. Pertinent negatives for hypoglycemia complications include no blackouts. Symptoms are worsening. There are no diabetic complications. Risk factors for coronary artery disease include diabetes mellitus, tobacco exposure and male sex. Current diabetic treatment includes insulin injections. His weight is decreasing steadily. He is following a generally unhealthy diet. He has not had a previous visit with a dietitian. He participates in exercise intermittently. His home blood glucose trend is increasing steadily. His breakfast blood glucose range is generally >200 mg/dl. His lunch blood glucose range is generally >200 mg/dl. His dinner blood glucose range is generally >200 mg/dl. His bedtime blood glucose range is generally >200 mg/dl. His overall blood glucose range is >200 mg/dl. ( He missed his appointment since more than a year.  He presents with incomplete monitoring and logs.  He is recent A1c was 9.7% increasing from 8.5% during his last visit.  He lost coverage for his Dexcom, he did not continue on his prescription medications including Synthroid.) An ACE inhibitor/angiotensin II receptor blocker is not being taken.    Review of Systems  Constitutional:  Negative for chills, fatigue, fever and unexpected weight change.  HENT:  Negative for dental problem, mouth sores and trouble swallowing.   Eyes:  Negative for visual disturbance.  Respiratory:  Negative for cough, choking, chest tightness, shortness of breath and wheezing.   Cardiovascular:  Negative for chest pain, palpitations and leg swelling.  Gastrointestinal:  Negative for abdominal distention, abdominal pain, constipation, diarrhea, nausea and vomiting.  Endocrine: Negative for polydipsia, polyphagia and polyuria.  Genitourinary:  Negative for dysuria, flank pain, hematuria and urgency.  Musculoskeletal:  Negative for back pain, gait problem, myalgias and neck pain.  Skin:  Negative for pallor, rash and wound.   Neurological:  Negative for seizures, syncope, weakness, numbness and headaches.  Psychiatric/Behavioral:  Negative for confusion and dysphoric mood.    Objective:    BP 98/66    Pulse 88    Ht 6\' 1"  (1.854 m)    Wt 149 lb (67.6 kg)    BMI 19.66 kg/m   Wt Readings from Last 3 Encounters:  08/28/21 149 lb (67.6 kg)  11/30/19 157 lb 12.8 oz (71.6 kg)  03/05/19 146 lb (66.2 kg)     Physical Exam Constitutional:      General: He is not in acute distress.    Appearance: He is well-developed.  HENT:     Head: Normocephalic and atraumatic.  Neck:     Thyroid: No thyromegaly.     Trachea: No tracheal deviation.  Cardiovascular:     Rate and Rhythm: Normal rate.     Pulses:  Dorsalis pedis pulses are 1+ on the right side and 1+ on the left side.       Posterior tibial pulses are 1+ on the right side and 1+ on the left side.     Heart sounds: S1 normal and S2 normal. No murmur heard.   No gallop.  Pulmonary:     Effort: Pulmonary effort is normal. No respiratory distress.     Breath sounds: No wheezing.  Abdominal:     General: There is no distension.     Tenderness: There is no abdominal tenderness. There is no guarding.  Musculoskeletal:     Right shoulder: No swelling or deformity.     Cervical back: Normal range of motion and neck supple.  Skin:    General: Skin is warm and dry.     Findings: No rash.     Nails: There is no clubbing.  Neurological:     Mental Status: He is alert and oriented to person, place, and time.     Cranial Nerves: No cranial nerve deficit.     Sensory: No sensory deficit.     Gait: Gait normal.     Deep Tendon Reflexes: Reflexes are normal and symmetric.  Psychiatric:        Speech: Speech normal.        Behavior: Behavior is cooperative.        Judgment: Judgment normal.    No results found for this or any previous visit (from the past 2160 hour(s)).  Lipid Panel     Component Value Date/Time   CHOL 195 03/10/2020 1046   TRIG  80 03/10/2020 1046   HDL 65 03/10/2020 1046   LDLCALC 116 (H) 03/10/2020 1046      Assessment & Plan:   1. Uncontrolled type 1 diabetes mellitus with hyperglycemia ([place])  - Jose Walters has currently uncontrolled symptomatic type 1 DM since 41   years of age.    He missed his appointment since more than a year.  He presents with incomplete monitoring and logs.  He is recent A1c was 9.7% increasing from 8.5% during his last visit.  He lost coverage for his Dexcom, he did not continue on his prescription medications including Synthroid.   He has prior history of repeated episodes of diabetic ketoacidosis at least on 4 separate occasions in the past most recent in February 2018.   -his diabetes is complicated by recurrent DKA's, chronic heavy smoking and Jose Walters remains at a high risk for more acute and chronic complications which include CAD, CVA, CKD, retinopathy, and neuropathy. These are all discussed in detail with the patient.  - I have counseled him on diet management  by adopting a carbohydrate restricted/protein rich diet.  - he  admits there is a room for improvement in his diet and drink choices. -  Suggestion is made for him to avoid simple carbohydrates  from his diet including Cakes, Sweet Desserts / Pastries, Ice Cream, Soda (diet and regular), Sweet Tea, Candies, Chips, Cookies, Sweet Pastries,  Store Bought Juices, Alcohol in Excess of  1-2 drinks a day, Artificial Sweeteners, Coffee Creamer, and "Sugar-free" Products. This will help patient to have stable blood glucose profile and potentially avoid unintended weight gain.  - I encouraged him to switch to  unprocessed or minimally processed complex starch and increased protein intake (animal or plant source), fruits, and vegetables.  - he is advised to stick to a routine mealtimes to eat 3 meals  a  day and avoid unnecessary snacks ( to snack only to correct hypoglycemia).   - he has been scheduled with Jose Walters, RDN, CDE for individualized diabetes education.  - I have approached him with the following individualized plan to manage diabetes and patient agrees:   -Patient struggles to maintain control of his diabetes.  I discussed and prescribed the freestyle libre device for him.  This patient will benefit from such a device tremendously.    In the meantime,  he is approached to reengage and start monitoring blood glucose 4 times a day-before meals and at bedtime.  He is approached to resume his intensive treatment with Toujeo 30 units nightly, NovoLog 6-12 units 3 times daily AC for Premeal blood glucose readings above 90 mg/day.    - Patient is warned not to take insulin without proper monitoring per orders. -Adjustment parameters are given for hypo and hyperglycemia in writing. -Patient is encouraged to call clinic for blood glucose levels less than 70 or above 200 mg /dl. -He is not a candidate for metformin, SGLT2 inhibitors, nor incretin therapy.  - Patient specific target  A1c;  LDL, HDL, Triglycerides, and  Waist Circumference were discussed in detail.  2) BP/HTN:  His blood pressure is controlled to target. 3) Lipids/HPL:   Recent lipid panel showed uncontrolled LDL at 93, HDL of 64.  He will be observed without statin treatment.   4)  Weight/Diet: He has weight deficit, lost 10+ pounds since last visit.  This could be worsened by glycosuria.  He will be offered a baseline evaluation for adrenal sufficiency on subsequent visits.   He is encouraged during increase his whole-grain carbs, protein and fruits/vegetables along with proper dosing of his insulin.  5) hypothyroidism: -He is recent thyroid function tests are consistent with under replacement, however this is due to his interruption of levothyroxine intake.  I discussed and resumed his levothyroxine 50 mcg p.o. daily before breakfast.     - We discussed about the correct intake of his thyroid hormone, on empty stomach at  fasting, with water, separated by at least 30 minutes from breakfast and other medications,  and separated by more than 4 hours from calcium, iron, multivitamins, acid reflux medications (PPIs). -Patient is made aware of the fact that thyroid hormone replacement is needed for life, dose to be adjusted by periodic monitoring of thyroid function tests.  6) Chronic Care/Health Maintenance:  -he   is encouraged to continue to follow up with Ophthalmology, Dentist,  Podiatrist at least yearly or according to recommendations, and he is extensively counseled for smoking cessation.     I have recommended yearly flu vaccine and pneumonia vaccination at least every 5 years; moderate intensity exercise for up to 150 minutes weekly; and  sleep for at least 7 hours a day.  The patient was counseled on the dangers of tobacco use, and was advised to quit.  Reviewed strategies to maximize success, including removing cigarettes and smoking materials from environment.   - I advised patient to maintain close follow up with [name], MD for primary care needs.   I spent 44 minutes in the care of the patient today including review of labs from [facility], Lipids, Thyroid Function, Hematology (current and previous including abstractions from other facilities); face-to-face time discussing  his blood glucose readings/logs, discussing hypoglycemia and hyperglycemia episodes and symptoms, medications doses, his options of short and long term treatment based on the latest standards of care / guidelines;  discussion about incorporating  lifestyle medicine;  and documenting the encounter.    Please refer to Patient Instructions for Blood Glucose Monitoring and Insulin/Medications Dosing Guide"  in media tab for additional information. Please  also refer to " Patient Self Inventory" in the Media  tab for reviewed elements of pertinent patient history.  Avie Arenas participated in the discussions, expressed understanding, and  voiced agreement with the above plans.  All questions were answered to his satisfaction. he is encouraged to contact clinic should he have any questions or concerns prior to his return visit.    Follow up plan: - Return in about 2 weeks (around 09/11/2021) for F/U with Meter and Logs Only - no Labs.  Glade Lloyd, MD [facility] [address] [place], [place] [address] Phone: [phone]  Fax: [phone]    08/28/2021, 2:20 PM  This note was partially dictated with voice recognition software. Similar sounding words can be transcribed inadequately or may not  be corrected upon review.

## 2021-09-15 ENCOUNTER — Ambulatory Visit: Payer: BC Managed Care – PPO | Admitting: "Endocrinology
